# Patient Record
Sex: Female | Born: 1990 | Race: White | Hispanic: No | Marital: Single | Smoking: Never smoker
Health system: Southern US, Community
[De-identification: ages and names within clinical notes are randomized; demographics above are authoritative.]

## PROBLEM LIST (undated history)

## (undated) DIAGNOSIS — Q909 Down syndrome, unspecified: Secondary | ICD-10-CM

## (undated) DIAGNOSIS — E039 Hypothyroidism, unspecified: Secondary | ICD-10-CM

## (undated) DIAGNOSIS — J302 Other seasonal allergic rhinitis: Secondary | ICD-10-CM

## (undated) DIAGNOSIS — B009 Herpesviral infection, unspecified: Secondary | ICD-10-CM

## (undated) HISTORY — DX: Down syndrome, unspecified: Q90.9

## (undated) HISTORY — DX: Other seasonal allergic rhinitis: J30.2

## (undated) HISTORY — DX: Herpesviral infection, unspecified: B00.9

## (undated) HISTORY — DX: Hypothyroidism, unspecified: E03.9

## (undated) HISTORY — PX: NO PAST SURGERIES: SHX2092

---

## 2013-03-05 LAB — VITAMIN D 25 HYDROXY (VIT D DEFICIENCY, FRACTURES): Vit D, 25-Hydroxy: 19.2

## 2013-03-05 LAB — LIPID PANEL
Cholesterol: 208 mg/dL — AB (ref 0–200)
HDL: 60 mg/dL (ref 35–70)
LDL (calc): 136
Triglycerides: 60

## 2013-06-03 ENCOUNTER — Telehealth: Payer: Self-pay | Admitting: Family Medicine

## 2013-06-03 NOTE — Telephone Encounter (Signed)
Recommend [facility] evaluation for severe diarrhea with weight loss as I don't think we'll be able to get pt in this week. May place on Monday 2/23 at 2:15 for 30 min appt.

## 2013-06-03 NOTE — Telephone Encounter (Signed)
Pt has down syndrome and is new to this area. Per her home care coordinator pt is experiencing severe diarrhea and weight loss.  They suspect she possibly has Celiac disease.  Pt is also not comfortable w/drs., but is not aggressive.They have heard great things about you and are trying to est w/you as her PCP.  Your first new patient appmt is not until Aug 17, 2013, but they feel pt needs to be seen prior to this.  Can you accommodate a new patient apptmt w/in the next couple of weeks?   I'm not sure if you would want/need a 45 min apptmt or 30 min apptmt w/this pt. Thank you.

## 2013-06-04 NOTE — Telephone Encounter (Signed)
Pt scheduled 06/08/2013 @ 2:15 p.m.

## 2013-06-08 ENCOUNTER — Ambulatory Visit (INDEPENDENT_AMBULATORY_CARE_PROVIDER_SITE_OTHER): Payer: BC Managed Care – PPO | Admitting: Family Medicine

## 2013-06-08 ENCOUNTER — Encounter: Payer: Self-pay | Admitting: Family Medicine

## 2013-06-08 ENCOUNTER — Telehealth: Payer: Self-pay

## 2013-06-08 VITALS — BP 110/78 | HR 84 | Temp 98.1°F | Ht 58.75 in | Wt 120.0 lb

## 2013-06-08 DIAGNOSIS — J309 Allergic rhinitis, unspecified: Secondary | ICD-10-CM

## 2013-06-08 DIAGNOSIS — J302 Other seasonal allergic rhinitis: Secondary | ICD-10-CM | POA: Insufficient documentation

## 2013-06-08 DIAGNOSIS — Q909 Down syndrome, unspecified: Secondary | ICD-10-CM

## 2013-06-08 DIAGNOSIS — E039 Hypothyroidism, unspecified: Secondary | ICD-10-CM

## 2013-06-08 DIAGNOSIS — R197 Diarrhea, unspecified: Secondary | ICD-10-CM

## 2013-06-08 DIAGNOSIS — K130 Diseases of lips: Secondary | ICD-10-CM | POA: Insufficient documentation

## 2013-06-08 DIAGNOSIS — L089 Local infection of the skin and subcutaneous tissue, unspecified: Secondary | ICD-10-CM

## 2013-06-08 MED ORDER — MUPIROCIN CALCIUM 2 % EX CREA: 1.0000 "application " | TOPICAL_CREAM | Freq: Two times a day (BID) | CUTANEOUS | Status: DC

## 2013-06-08 MED ORDER — MUPIROCIN 2 % EX OINT: 1.0000 "application " | TOPICAL_OINTMENT | Freq: Two times a day (BID) | CUTANEOUS | Status: DC

## 2013-06-08 NOTE — Telephone Encounter (Signed)
Teresa Zhang with [facility] left v/m ; medicaid will not approve bactroban cream but will cover bactroban ointment; Teresa Zhang request new rx for bactroban ointment.Please advise.

## 2013-06-08 NOTE — Patient Instructions (Addendum)
For spot on belly, use antibiotic ointment and continue warm compresses.  This should resolve within the next few days. For lips try antibiotic ointment. For diarrhea, I will send you home with stool tests and would like to check blood work today. Good to meet you today, call us with questions.

## 2013-06-08 NOTE — Assessment & Plan Note (Signed)
Lives in group home.  Overall very well adjusted.

## 2013-06-08 NOTE — Assessment & Plan Note (Signed)
Last TSH 10/20144 WNL.  Recheck today.  Continue levothyroxine.

## 2013-06-08 NOTE — Progress Notes (Signed)
Pre visit review using our clinic review tool, if applicable. No additional management support is needed unless otherwise documented below in the visit note. 

## 2013-06-08 NOTE — Assessment & Plan Note (Signed)
Stable on PRN antihistamine. Continue.

## 2013-06-08 NOTE — Assessment & Plan Note (Signed)
Given honey colored crust, ?impetigo.  Prescribed mupirocin ointment, discussed use with abdominal wall pustule.

## 2013-06-08 NOTE — Assessment & Plan Note (Addendum)
Subacute for last 1.5 months. ?improvement off gluten and dairy. Will start with blood work (TSH, CBC, CMP, TTG) to eval for malabsorption but will also send off for C diff test as well as stool cultures to help r/o infectious cause. Doubt IBS. Pt/caregivers agree with plan. She has lost significant amt weight since she's moved into [facility] - ?overtreatment of hypothyroidism.

## 2013-06-08 NOTE — Progress Notes (Signed)
BP 110/78  Pulse 84  Temp(Src) 98.1 F (36.7 C) (Oral)  Ht 4' 10.75" (1.492 m)  Wt 120 lb (54.432 kg)  BMI 24.45 kg/m2  LMP 05/16/2013   CC: new pt to establish  Subjective:    Patient ID: Teresa Zhang, female    DOB: 10-23-1990, 23 y.o.   MRN: [id number]  HPI: Teresa Zhang is a 23 y.o. female presenting on 06/08/2013 with [place] is a pleasant 23 yo with h/o Down's Syndrome who presents with 2 caregivers Teresa Zhang is home coordinator) today to establish care.  Lives at [place].  Prior lived in [place] with parents Teresa Zhang mom).  Teresa Zhang moved in 03/2013.  Weight loss noted over last 2 months, but she's more active as well.  H/o hypothyroidism currently controlled on levothyroxine.  H/o seasonal allergies.  Would like lump on back checked.  Angular cheilitis - come and go.  Diarrhea - ongoing for the last 1+.  Intermittent diarrheal episodes loose stools mixed with watery stools unable to control.  No night time episodes.  05/31/2013 - changed diet to gluten/dairy free.  This has improved diarrhea - no unexplained episode until this morning.  Having episodes of fecal incontinence - surprise her.  Has 2-3 stools a day, some formed, some watery.  Light colored stools.  Uses well water (recently tested, normal according to [facility]).  No other sick contacts at home with diarrhea.  No recent abx use. No fevers/chills, abd pain, nausea/vomiting.  No blood in stool.  Occasional lower ribcage pain with diarrhea.  Recently had TSH checked 01/2013  Preventative: Has never had pap smear.   Relevant past medical, surgical, family and social history reviewed and updated as indicated.  Allergies and medications reviewed and updated. No current outpatient prescriptions on file prior to visit.   No current facility-administered medications on file prior to visit.    Review of Systems Per HPI unless specifically indicated above    Objective:    BP 110/78  Pulse 84   Temp(Src) 98.1 F (36.7 C) (Oral)  Ht 4' 10.75" (1.492 m)  Wt 120 lb (54.432 kg)  BMI 24.45 kg/m2  LMP 05/16/2013  Physical Exam  Nursing note and vitals reviewed. Constitutional: She is oriented to person, place, and time. She appears well-developed and well-nourished. No distress.  HENT:  Head: Normocephalic and atraumatic.    Right Ear: Hearing, tympanic membrane, external ear and ear canal normal.  Left Ear: Hearing, tympanic membrane, external ear and ear canal normal.  Nose: Nose normal.  Mouth/Throat: Uvula is midline, oropharynx is clear and moist and mucous membranes are normal. No oropharyngeal exudate, posterior oropharyngeal edema or posterior oropharyngeal erythema.  Peeling with honey colored crusting on edges of lips  Eyes: Conjunctivae and EOM are normal. Pupils are equal, round, and reactive to light. No scleral icterus.  Neck: Normal range of motion. Neck supple.  Cardiovascular: Normal rate, regular rhythm, normal heart sounds and intact distal pulses.   No murmur heard. Pulses:      Radial pulses are 2+ on the right side, and 2+ on the left side.  Pulmonary/Chest: Effort normal and breath sounds normal. No respiratory distress. She has no wheezes. She has no rales.  Abdominal: Soft. Bowel sounds are normal. She exhibits no distension and no mass. There is no hepatosplenomegaly. There is no tenderness. There is no rigidity, no rebound, no guarding, no CVA tenderness and negative Murphy's sign.  Musculoskeletal: Normal range of motion. She exhibits no  edema.  Lymphadenopathy:    She has no cervical adenopathy.  Neurological: She is alert and oriented to person, place, and time.  CN grossly intact, station and gait intact  Skin: Skin is warm and dry. No rash noted.  Cafe au lait spot on back Pustule with surrounding erythema on upper abdomen wall  Psychiatric: She has a normal mood and affect. Her behavior is normal. Judgment and thought content normal.   No  results found for this or any previous visit.    Assessment & Plan:   Problem List Items Addressed This Visit   Angular cheilitis     Given honey colored crust, ?impetigo.  Prescribed mupirocin ointment, discussed use with abdominal wall pustule.    Diarrhea - Primary     Subacute for last 1.5 months. ?improvement off gluten and dairy. Will start with blood work (TSH, CBC, CMP, TTG) to eval for malabsorption but will also send off for C diff test as well as stool cultures to help r/o infectious cause. Doubt IBS. Pt/caregivers agree with plan. She has lost significant amt weight since she's moved into [facility] - ?overtreatment of hypothyroidism.    Relevant Orders      Comprehensive metabolic panel      TSH      CBC with Differential      Clostridium difficile EIA      Stool culture      Fecal Lactoferrin      Tissue Transglutaminase, IGG   Down's syndrome     Lives in group home.  Overall very well adjusted.    Relevant Orders      Tissue Transglutaminase, IGG   Hypothyroidism     Last TSH 10/20144 WNL.  Recheck today.  Continue levothyroxine.    Relevant Medications      levothyroxine (SYNTHROID, LEVOTHROID) 125 MCG tablet   Other Relevant Orders      Tissue Transglutaminase, IGG   Pustule     Advised warm compresses and mupirocin ointment.    Seasonal allergies     Stable on PRN antihistamine. Continue.    Relevant Orders      Tissue Transglutaminase, IGG       Follow up plan: Return as needed, for follow up.

## 2013-06-08 NOTE — Telephone Encounter (Signed)
Sent in ointment

## 2013-06-09 DIAGNOSIS — L089 Local infection of the skin and subcutaneous tissue, unspecified: Secondary | ICD-10-CM | POA: Insufficient documentation

## 2013-06-09 LAB — CBC WITH DIFFERENTIAL/PLATELET
Basophils Absolute: 0 10*3/uL (ref 0.0–0.1)
Basophils Relative: 0.5 % (ref 0.0–3.0)
EOS ABS: 0.1 10*3/uL (ref 0.0–0.7)
Eosinophils Relative: 1 % (ref 0.0–5.0)
HCT: 42.7 % (ref 36.0–46.0)
Hemoglobin: 13.9 g/dL (ref 12.0–15.0)
Lymphocytes Relative: 21.2 % (ref 12.0–46.0)
Lymphs Abs: 1.5 10*3/uL (ref 0.7–4.0)
MCHC: 32.7 g/dL (ref 30.0–36.0)
MCV: 95.4 fl (ref 78.0–100.0)
MONO ABS: 0.5 10*3/uL (ref 0.1–1.0)
Monocytes Relative: 7.1 % (ref 3.0–12.0)
NEUTROS PCT: 70.2 % (ref 43.0–77.0)
Neutro Abs: 4.9 10*3/uL (ref 1.4–7.7)
PLATELETS: 300 10*3/uL (ref 150.0–400.0)
RBC: 4.47 Mil/uL (ref 3.87–5.11)
RDW: 14 % (ref 11.5–14.6)
WBC: 7 10*3/uL (ref 4.5–10.5)

## 2013-06-09 LAB — COMPREHENSIVE METABOLIC PANEL
ALT: 24 U/L (ref 0–35)
AST: 28 U/L (ref 0–37)
Albumin: 4.4 g/dL (ref 3.5–5.2)
Alkaline Phosphatase: 68 U/L (ref 39–117)
BUN: 10 mg/dL (ref 6–23)
CALCIUM: 9.5 mg/dL (ref 8.4–10.5)
CHLORIDE: 110 meq/L (ref 96–112)
CO2: 25 mEq/L (ref 19–32)
CREATININE: 0.7 mg/dL (ref 0.4–1.2)
GFR: 107.33 mL/min (ref >60.00)
Glucose, Bld: 82 mg/dL (ref 70–99)
POTASSIUM: 4 meq/L (ref 3.5–5.1)
Sodium: 141 mEq/L (ref 135–145)
Total Bilirubin: 0.4 mg/dL (ref 0.3–1.2)
Total Protein: 8.2 g/dL (ref 6.0–8.3)

## 2013-06-09 LAB — TSH: TSH: 0.48 u[IU]/mL (ref 0.35–5.50)

## 2013-06-09 LAB — TISSUE TRANSGLUTAMINASE, IGG: Tissue Transglut Ab: 10.6 U/mL (ref <20)

## 2013-06-09 NOTE — Assessment & Plan Note (Signed)
Advised warm compresses and mupirocin ointment.

## 2013-06-10 ENCOUNTER — Encounter: Payer: Self-pay | Admitting: *Deleted

## 2013-06-10 ENCOUNTER — Ambulatory Visit: Payer: BC Managed Care – PPO

## 2013-06-10 DIAGNOSIS — E039 Hypothyroidism, unspecified: Secondary | ICD-10-CM

## 2013-06-10 LAB — T4, FREE: FREE T4: 0.9 ng/dL (ref 0.60–1.60)

## 2013-06-10 NOTE — Addendum Note (Signed)
Addended by: Ellamae Sia on: 06/10/2013 11:40 AM   Modules accepted: Orders

## 2013-06-11 LAB — CLOSTRIDIUM DIFFICILE EIA: CDIFTX: NEGATIVE

## 2013-06-11 LAB — FECAL LACTOFERRIN, QUANT: LACTOFERRIN: POSITIVE

## 2013-06-14 LAB — STOOL CULTURE

## 2013-06-17 ENCOUNTER — Encounter: Payer: Self-pay | Admitting: Family Medicine

## 2013-06-19 ENCOUNTER — Other Ambulatory Visit: Payer: Self-pay | Admitting: Family Medicine

## 2013-06-19 MED ORDER — CIPROFLOXACIN HCL 500 MG PO TABS: 500.0000 mg | ORAL_TABLET | Freq: Two times a day (BID) | ORAL | Status: DC

## 2013-06-30 ENCOUNTER — Ambulatory Visit (INDEPENDENT_AMBULATORY_CARE_PROVIDER_SITE_OTHER): Payer: BC Managed Care – PPO | Admitting: Internal Medicine

## 2013-06-30 ENCOUNTER — Encounter: Payer: Self-pay | Admitting: Internal Medicine

## 2013-06-30 VITALS — BP 106/74 | HR 67 | Temp 97.8°F | Wt 117.0 lb

## 2013-06-30 DIAGNOSIS — J029 Acute pharyngitis, unspecified: Secondary | ICD-10-CM

## 2013-06-30 DIAGNOSIS — J309 Allergic rhinitis, unspecified: Secondary | ICD-10-CM

## 2013-06-30 MED ORDER — GUAIFENESIN 100 MG/5ML PO SYRP: 200.0000 mg | ORAL_SOLUTION | Freq: Three times a day (TID) | ORAL | Status: DC | PRN

## 2013-06-30 NOTE — Patient Instructions (Addendum)
Pharyngitis °Pharyngitis is redness, pain, and swelling (inflammation) of your pharynx.  °CAUSES  °Pharyngitis is usually caused by infection. Most of the time, these infections are from viruses (viral) and are part of a cold. However, sometimes pharyngitis is caused by bacteria (bacterial). Pharyngitis can also be caused by allergies. Viral pharyngitis may be spread from person to person by coughing, sneezing, and personal items or utensils (cups, forks, spoons, toothbrushes). Bacterial pharyngitis may be spread from person to person by more intimate contact, such as kissing.  °SIGNS AND SYMPTOMS  °Symptoms of pharyngitis include:   °· Sore throat.   °· Tiredness (fatigue).   °· Low-grade fever.   °· Headache. °· Joint pain and muscle aches. °· Skin rashes. °· Swollen lymph nodes. °· Plaque-like film on throat or tonsils (often seen with bacterial pharyngitis). °DIAGNOSIS  °Your health care provider will ask you questions about your illness and your symptoms. Your medical history, along with a physical exam, is often all that is needed to diagnose pharyngitis. Sometimes, a rapid strep test is done. Other lab tests may also be done, depending on the suspected cause.  °TREATMENT  °Viral pharyngitis will usually get better in 3 4 days without the use of medicine. Bacterial pharyngitis is treated with medicines that kill germs (antibiotics).  °HOME CARE INSTRUCTIONS  °· Drink enough water and fluids to keep your urine clear or pale yellow.   °· Only take over-the-counter or prescription medicines as directed by your health care provider:   °· If you are prescribed antibiotics, make sure you finish them even if you start to feel better.   °· Do not take aspirin.   °· Get lots of rest.   °· Gargle with 8 oz of salt water (½ tsp of salt per 1 qt of water) as often as every 1 2 hours to soothe your throat.   °· Throat lozenges (if you are not at risk for choking) or sprays may be used to soothe your throat. °SEEK MEDICAL  CARE IF:  °· You have large, tender lumps in your neck. °· You have a rash. °· You cough up green, yellow-brown, or bloody spit. °SEEK IMMEDIATE MEDICAL CARE IF:  °· Your neck becomes stiff. °· You drool or are unable to swallow liquids. °· You vomit or are unable to keep medicines or liquids down. °· You have severe pain that does not go away with the use of recommended medicines. °· You have trouble breathing (not caused by a stuffy nose). °MAKE SURE YOU:  °· Understand these instructions. °· Will watch your condition. °· Will get help right away if you are not doing well or get worse. °Document Released: 04/02/2005 Document Revised: 01/21/2013 Document Reviewed: 12/08/2012 °ExitCare® Patient Information ©2014 [employer]. ° °

## 2013-06-30 NOTE — Progress Notes (Signed)
Pre visit review using our clinic review tool, if applicable. No additional management support is needed unless otherwise documented below in the visit note. 

## 2013-06-30 NOTE — Progress Notes (Signed)
HPI  Pt presents to the clinic today with c/o sore throat and cough. She reports this started yesterday. She also c/o runny nose and itchy ears. She is having difficulty with eating and some pain with swallowing. She does have a history of seasonal allergies. She has not had sick contacts. She is not sure what she has tried OTC.  Review of Systems      Past Medical History  Diagnosis Date  . Down's syndrome   . Seasonal allergies   . Hypothyroidism     Family History  Problem Relation Age of Onset  . Cancer Other     colon, great uncle  . Cancer Other     prostate, grandparent  . Hypertension Father   . Hyperlipidemia Father   . Hashimoto's thyroiditis Sister   . Cancer Father     throat    History   Social History  . Marital Status: Single    Spouse Name: N/A    Number of Children: N/A  . Years of Education: N/A   Occupational History  . Not on file.   Social History Main Topics  . Smoking status: Never Smoker   . Smokeless tobacco: Never Used  . Alcohol Use: No  . Drug Use: No  . Sexual Activity: Not on file   Other Topics Concern  . Not on file   Social History Narrative   Lives at [facility]    No Known Allergies   Constitutional:  Denies headache, fatigue, fever or abrupt weight changes. sitive feve HEENT:  Positive sore throat. Denies eye redness, eye pain, pressure behind the eyes, facial pain, nasal congestion, ear pain, ringing in the ears, wax buildup, runny nose or bloody nose. Respiratory: Positive cough. Denies difficulty breathing or shortness of breath.  Cardiovascular: Denies chest pain, chest tightness, palpitations or swelling in the hands or feet.   No other specific complaints in a complete review of systems (except as listed in HPI above).  Objective:   BP 106/74  Pulse 67  Temp(Src) 97.8 F (36.6 C) (Oral)  Wt 117 lb (53.071 kg)  SpO2 97%  LMP 05/16/2013 Wt Readings from Last 3 Encounters:  06/30/13  117 lb (53.071 kg)  06/08/13 120 lb (54.432 kg)     General: PT with down syndrome, in NAD HEENT: Head: normal shape and size; Eyes: sclera white, no icterus, conjunctiva pink, PERRLA and EOMs intact; Ears: Tm's gray and intact, normal light reflex; Nose: mucosa pink and moist, septum midline; Throat/Mouth: + PND. Teeth present, mucosa erythematous and moist, no exudate noted, no lesions or ulcerations noted.  Neck: Neck supple, trachea midline. No massses, lumps or thyromegaly present.  Cardiovascular: Normal rate and rhythm. S1,S2 noted.  No murmur, rubs or gallops noted. No JVD or BLE edema. No carotid bruits noted. Pulmonary/Chest: Normal effort and positive vesicular breath sounds. No respiratory distress. No wheezes, rales or ronchi noted.      Assessment & Plan:   Allergic Rhinitis:  Get some rest and drink plenty of water Do salt water gargles for the sore throat Continue zyrtec OTC daily eRx for robitussin  RTC as needed or if symptoms persist.

## 2013-07-03 ENCOUNTER — Encounter: Payer: Self-pay | Admitting: Family Medicine

## 2013-07-03 MED ORDER — LEVOTHYROXINE SODIUM 125 MCG PO TABS: 125.0000 ug | ORAL_TABLET | Freq: Every day | ORAL | Status: DC

## 2013-07-09 ENCOUNTER — Encounter: Payer: Self-pay | Admitting: Internal Medicine

## 2013-07-09 ENCOUNTER — Ambulatory Visit (INDEPENDENT_AMBULATORY_CARE_PROVIDER_SITE_OTHER): Payer: BC Managed Care – PPO | Admitting: Internal Medicine

## 2013-07-09 VITALS — BP 120/70 | HR 54 | Temp 98.2°F | Wt 116.5 lb

## 2013-07-09 DIAGNOSIS — J309 Allergic rhinitis, unspecified: Secondary | ICD-10-CM

## 2013-07-09 DIAGNOSIS — J069 Acute upper respiratory infection, unspecified: Secondary | ICD-10-CM

## 2013-07-09 MED ORDER — FLUTICASONE PROPIONATE 50 MCG/ACT NA SUSP: 2.0000 | Freq: Every day | NASAL | Status: DC

## 2013-07-09 MED ORDER — AZITHROMYCIN 250 MG PO TABS
ORAL_TABLET | ORAL | Status: DC
Start: 2013-07-09 — End: 2013-08-06

## 2013-07-09 NOTE — Progress Notes (Signed)
Pre visit review using our clinic review tool, if applicable. No additional management support is needed unless otherwise documented below in the visit note. 

## 2013-07-09 NOTE — Patient Instructions (Addendum)

## 2013-07-09 NOTE — Progress Notes (Addendum)
HPI  Pt presents to the clinic today with c/o cough and chest congestion. This started 10 days ago. She also c/o sore throat and wheezing at night. She was seen for similar symptoms 06/30/13. It was felt to be related to allergies and she was advised to take zyrtec OTC. She has been taking zyrtec without any relief.   Review of Systems      Past Medical History  Diagnosis Date  . Down's syndrome   . Seasonal allergies   . Hypothyroidism     Family History  Problem Relation Age of Onset  . Cancer Other     colon, great uncle  . Cancer Other     prostate, grandparent  . Hypertension Father   . Hyperlipidemia Father   . Hashimoto's thyroiditis Sister   . Cancer Father     throat    History   Social History  . Marital Status: Single    Spouse Name: N/A    Number of Children: N/A  . Years of Education: N/A   Occupational History  . Not on file.   Social History Main Topics  . Smoking status: Never Smoker   . Smokeless tobacco: Never Used  . Alcohol Use: No  . Drug Use: No  . Sexual Activity: Not on file   Other Topics Concern  . Not on file   Social History Narrative   Lives at [place]    No Known Allergies   Constitutional:  Denies headache, fatigue, fever or abrupt weight changes.  HEENT:  Positive sore throat. Denies eye redness, eye pain, pressure behind the eyes, facial pain, nasal congestion, ear pain, ringing in the ears, wax buildup, runny nose or bloody nose. Respiratory: Positive cough. Denies difficulty breathing or shortness of breath.  Cardiovascular: Denies chest pain, chest tightness, palpitations or swelling in the hands or feet.   No other specific complaints in a complete review of systems (except as listed in HPI above).  Objective:   BP 120/70  Pulse 54  Temp(Src) 98.2 F (36.8 C) (Oral)  Wt 116 lb 8 oz (52.844 kg)  SpO2 98%  LMP 06/29/2013 Wt Readings from Last 3 Encounters:  07/09/13 116 lb 8 oz (52.844  kg)  06/30/13 117 lb (53.071 kg)  06/08/13 120 lb (54.432 kg)     General: Appears her stated age, well developed, well nourished in NAD. HEENT: Head: normal shape and size; Eyes: sclera white, no icterus, conjunctiva pink, PERRLA and EOMs intact; Ears: Tm's gray and intact, normal light reflex; Nose: mucosa pink and moist, septum midline; Throat/Mouth: + PND. Teeth present, mucosa erythematous and moist, no exudate noted, no lesions or ulcerations noted.  Neck: Neck supple, trachea midline. No massses, lumps or thyromegaly present.  Cardiovascular: Normal rate and rhythm. S1,S2 noted.  No murmur, rubs or gallops noted. No JVD or BLE edema. No carotid bruits noted. Pulmonary/Chest: Normal effort and wheezing noted in the left upper lobe. No respiratory distress. No  rales or ronchi noted.      Assessment & Plan:   Allergic Rhinitis:  Get some rest and drink plenty of water Do salt water gargles for the sore throat eRx for flonase daily x 1 week  If no improvement, start z pack   RTC as needed or if symptoms persist.

## 2013-07-11 ENCOUNTER — Encounter: Payer: Self-pay | Admitting: Family Medicine

## 2013-08-03 ENCOUNTER — Encounter: Payer: Self-pay | Admitting: *Deleted

## 2013-08-06 ENCOUNTER — Ambulatory Visit (INDEPENDENT_AMBULATORY_CARE_PROVIDER_SITE_OTHER): Payer: BC Managed Care – PPO | Admitting: Internal Medicine

## 2013-08-06 ENCOUNTER — Encounter: Payer: Self-pay | Admitting: Internal Medicine

## 2013-08-06 VITALS — BP 104/68 | HR 74 | Temp 97.9°F | Wt 111.0 lb

## 2013-08-06 DIAGNOSIS — H9201 Otalgia, right ear: Secondary | ICD-10-CM

## 2013-08-06 DIAGNOSIS — R197 Diarrhea, unspecified: Secondary | ICD-10-CM

## 2013-08-06 DIAGNOSIS — H6120 Impacted cerumen, unspecified ear: Secondary | ICD-10-CM

## 2013-08-06 DIAGNOSIS — H9209 Otalgia, unspecified ear: Secondary | ICD-10-CM

## 2013-08-06 DIAGNOSIS — R634 Abnormal weight loss: Secondary | ICD-10-CM

## 2013-08-06 NOTE — Progress Notes (Signed)
Pre visit review using our clinic review tool, if applicable. No additional management support is needed unless otherwise documented below in the visit note. 

## 2013-08-06 NOTE — Patient Instructions (Addendum)
Otalgia °The most common reason for this in children is an infection of the middle ear. Pain from the middle ear is usually caused by a build-up of fluid and pressure behind the eardrum. Pain from an earache can be sharp, dull, or burning. The pain may be temporary or constant. The middle ear is connected to the nasal passages by a short narrow tube called the Eustachian tube. The Eustachian tube allows fluid to drain out of the middle ear, and helps keep the pressure in your ear equalized. °CAUSES  °A cold or allergy can block the Eustachian tube with inflammation and the build-up of secretions. This is especially likely in small children, because their Eustachian tube is shorter and more horizontal. When the Eustachian tube closes, the normal flow of fluid from the middle ear is stopped. Fluid can accumulate and cause stuffiness, pain, hearing loss, and an ear infection if germs start growing in this area. °SYMPTOMS  °The symptoms of an ear infection may include fever, ear pain, fussiness, increased crying, and irritability. Many children will have temporary and minor hearing loss during and right after an ear infection. Permanent hearing loss is rare, but the risk increases the more infections a child has. Other causes of ear pain include retained water in the outer ear canal from swimming and bathing. °Ear pain in adults is less likely to be from an ear infection. Ear pain may be referred from other locations. Referred pain may be from the joint between your jaw and the skull. It may also come from a tooth problem or problems in the neck. Other causes of ear pain include: °· A foreign body in the ear. °· Outer ear infection. °· Sinus infections. °· Impacted ear wax. °· Ear injury. °· Arthritis of the jaw or TMJ problems. °· Middle ear infection. °· Tooth infections. °· Sore throat with pain to the ears. °DIAGNOSIS  °Your caregiver can usually make the diagnosis by examining you. Sometimes other special studies,  including x-rays and lab work may be necessary. °TREATMENT  °· If antibiotics were prescribed, use them as directed and finish them even if you or your child's symptoms seem to be improved. °· Sometimes PE tubes are needed in children. These are little plastic tubes which are put into the eardrum during a simple surgical procedure. They allow fluid to drain easier and allow the pressure in the middle ear to equalize. This helps relieve the ear pain caused by pressure changes. °HOME CARE INSTRUCTIONS  °· Only take over-the-counter or prescription medicines for pain, discomfort, or fever as directed by your caregiver. DO NOT GIVE CHILDREN ASPIRIN because of the association of Reye's Syndrome in children taking aspirin. °· Use a cold pack applied to the outer ear for 15-20 minutes, 03-04 times per day or as needed may reduce pain. Do not apply ice directly to the skin. You may cause frost bite. °· Over-the-counter ear drops used as directed may be effective. Your caregiver may sometimes prescribe ear drops. °· Resting in an upright position may help reduce pressure in the middle ear and relieve pain. °· Ear pain caused by rapidly descending from high altitudes can be relieved by swallowing or chewing gum. Allowing infants to suck on a bottle during airplane travel can help. °· Do not smoke in the house or near children. If you are unable to quit smoking, smoke outside. °· Control allergies. °SEEK IMMEDIATE MEDICAL CARE IF:  °· You or your child are becoming sicker. °· Pain or fever   relief is not obtained with medicine. °· You or your child's symptoms (pain, fever, or irritability) do not improve within 24 to 48 hours or as instructed. °· Severe pain suddenly stops hurting. This may indicate a ruptured eardrum. °· You or your children develop new problems such as severe headaches, stiff neck, difficulty swallowing, or swelling of the face or around the ear. °Document Released: 11/18/2003 Document Revised: 06/25/2011  Document Reviewed: 03/24/2008 °Exit[employer]® Patient Information ©2014 [employer]. ° °

## 2013-08-06 NOTE — Progress Notes (Signed)
Subjective:    Patient ID: Teresa Zhang, female    DOB: 1990-07-13, 23 y.o.   MRN: [id number]  HPI  Pt presents to the clinic today with c/o right ear pain x 1 day. She denies fever, chills or body aches. She has not tried anything OTC. She denies hearing loss.  She is concerned about weight loss. She reports that she has lost 15 lbs since December. She has been eating better and exercising during that time but she stopped exercising 1 month ago but has continued to lose weight. She has also had diarrhea for the last 3 weeks but has now resolved. She denies blood in her stool. She is hypothyroid, but her last TSH was normal. She saw Dr. Lynnae Sandhoff for the same 06/08/13 and workup was normal.  Review of Systems      Past Medical History  Diagnosis Date  . Down's syndrome   . Seasonal allergies   . Hypothyroidism   . HSV-1 infection     Current Outpatient Prescriptions  Medication Sig Dispense Refill  . cetirizine (ZYRTEC) 10 MG tablet Take 10 mg by mouth daily.      . fluticasone (FLONASE) 50 MCG/ACT nasal spray Place 2 sprays into both nostrils daily.  16 g  6  . guaifenesin (ROBITUSSIN CHEST CONGESTION) 100 MG/5ML syrup Take 10 mLs (200 mg total) by mouth 3 (three) times daily as needed for cough.  120 mL  0  . ibuprofen (ADVIL,MOTRIN) 200 MG tablet Take 200 mg by mouth as needed.      Marland Kitchen levothyroxine (SYNTHROID, LEVOTHROID) 125 MCG tablet Take 1 tablet (125 mcg total) by mouth daily before breakfast.  90 tablet  3  . Multiple Vitamins-Minerals (MULTIVITAMIN GUMMIES ADULT PO) Take 1 tablet by mouth every Monday, Wednesday, and Friday.      . mupirocin ointment (BACTROBAN) 2 % Apply 1 application topically 2 (two) times daily. To affected areas on abdomen and lips.  30 g  0   No current facility-administered medications for this visit.    No Known Allergies  Family History  Problem Relation Age of Onset  . Cancer Other     colon, great uncle  . Cancer Other     prostate,  grandparent  . Hypertension Father   . Hyperlipidemia Father   . Hashimoto's thyroiditis Sister   . Cancer Father     throat    History   Social History  . Marital Status: Single    Spouse Name: N/A    Number of Children: N/A  . Years of Education: N/A   Occupational History  . Not on file.   Social History Main Topics  . Smoking status: Never Smoker   . Smokeless tobacco: Never Used  . Alcohol Use: No  . Drug Use: No  . Sexual Activity: Not on file   Other Topics Concern  . Not on file   Social History Narrative   Lives at [place]     Constitutional: Pt reports weight loss. Denies fever, malaise, fatigue, headache.  HEENT: Pt reports ear pain. Denies eye pain, eye redness, ringing in the ears, wax buildup, runny nose, nasal congestion, bloody nose, or sore throat. Respiratory: Denies difficulty breathing, shortness of breath, cough or sputum production.     No other specific complaints in a complete review of systems (except as listed in HPI above).  Objective:   Physical Exam   BP 104/68  Pulse 74  Temp(Src) 97.9  F (36.6 C) (Oral)  Wt 111 lb (50.349 kg)  SpO2 98%  LMP 06/29/2013 Wt Readings from Last 3 Encounters:  08/06/13 111 lb (50.349 kg)  07/09/13 116 lb 8 oz (52.844 kg)  06/30/13 117 lb (53.071 kg)    General: 23 yo female with down syndrome, well nourished in NAD. HEENT: Head: normal shape and size; Eyes: sclera white, no icterus, conjunctiva pink, PERRLA and EOMs intact; Ears: cerumen impaction bilaterally; Nose: mucosa pink and moist, septum midline; Throat/Mouth: Teeth present, mucosa pink and moist, no exudate, lesions or ulcerations noted.  Cardiovascular: Normal rate and rhythm. S1,S2 noted.  No murmur, rubs or gallops noted. No JVD or BLE edema. No carotid bruits noted. Pulmonary/Chest: Normal effort and positive vesicular breath sounds. No respiratory distress. No wheezes, rales or ronchi noted.      BMET      Component Value Date/Time   NA 141 06/08/2013 1538   K 4.0 06/08/2013 1538   CL 110 06/08/2013 1538   CO2 25 06/08/2013 1538   GLUCOSE 82 06/08/2013 1538   BUN 10 06/08/2013 1538   CREATININE 0.7 06/08/2013 1538   CALCIUM 9.5 06/08/2013 1538    Lipid Panel     Component Value Date/Time   CHOL 208* 03/05/2013   TRIG 60 03/05/2013   HDL 60 03/05/2013   LDLCALC 136 03/05/2013    CBC    Component Value Date/Time   WBC 7.0 06/08/2013 1538   RBC 4.47 06/08/2013 1538   HGB 13.9 06/08/2013 1538   HCT 42.7 06/08/2013 1538   PLT 300.0 06/08/2013 1538   MCV 95.4 06/08/2013 1538   MCHC 32.7 06/08/2013 1538   RDW 14.0 06/08/2013 1538   LYMPHSABS 1.5 06/08/2013 1538   MONOABS 0.5 06/08/2013 1538   EOSABS 0.1 06/08/2013 1538   BASOSABS 0.0 06/08/2013 1538    Hgb A1C No results found for this basename: HGBA1C        Assessment & Plan:   Otalgia, right ear secondary to cerumen impaction:  Manual lavage by CMA today OK to use debrox solution OTC prn for wax buildup  Weight loss and diarrhea:  Previous workup negative If it continues to bother you, would advise follow up with PCP  RTC as needed or if symptoms persist or worsen

## 2013-09-24 ENCOUNTER — Ambulatory Visit (INDEPENDENT_AMBULATORY_CARE_PROVIDER_SITE_OTHER): Payer: BC Managed Care – PPO | Admitting: Family Medicine

## 2013-09-24 ENCOUNTER — Encounter: Payer: Self-pay | Admitting: Family Medicine

## 2013-09-24 VITALS — BP 116/76 | HR 72 | Temp 98.2°F | Wt 111.2 lb

## 2013-09-24 DIAGNOSIS — J019 Acute sinusitis, unspecified: Secondary | ICD-10-CM | POA: Insufficient documentation

## 2013-09-24 MED ORDER — AMOXICILLIN-POT CLAVULANATE 875-125 MG PO TABS: 1.0000 | ORAL_TABLET | Freq: Two times a day (BID) | ORAL | Status: AC

## 2013-09-24 MED ORDER — HYDROCODONE-HOMATROPINE 5-1.5 MG/5ML PO SYRP: 5.0000 mL | ORAL_SOLUTION | Freq: Three times a day (TID) | ORAL | Status: DC | PRN

## 2013-09-24 NOTE — Progress Notes (Signed)
BP 116/76  Pulse 72  Temp(Src) 98.2 F (36.8 C) (Oral)  Wt 111 lb 4 oz (50.463 kg)  LMP 09/21/2013   CC: ?sinusitis  Subjective:    Patient ID: Teresa Zhang, female    DOB: Aug 25, 1990, 23 y.o.   MRN: [id number]  HPI: Teresa Zhang is a 23 y.o. female presenting on 09/24/2013 for Sinusitis   Presents with caregiver who helps give story. Teresa Zhang has a 10d h/o nasal congestion that has progressively worsened from clear to yellow, dry coughing that wakes her up at night at 1am and again at 4am.  abd pain/cramping - on period.  No fevers/chills, HA. No h/o asthma. No sick contacts at home. Taking flonase, as well as daytime/night time cold meds.  Past Medical History  Diagnosis Date  . Down's syndrome   . Seasonal allergies   . Hypothyroidism   . HSV-1 infection      Relevant past medical, surgical, family and social history reviewed and updated as indicated.  Allergies and medications reviewed and updated. Current Outpatient Prescriptions on File Prior to Visit  Medication Sig  . cetirizine (ZYRTEC) 10 MG tablet Take 10 mg by mouth daily.  . fluticasone (FLONASE) 50 MCG/ACT nasal spray Place 2 sprays into both nostrils daily.  Marland Kitchen ibuprofen (ADVIL,MOTRIN) 200 MG tablet Take 200 mg by mouth as needed.  Marland Kitchen levothyroxine (SYNTHROID, LEVOTHROID) 125 MCG tablet Take 1 tablet (125 mcg total) by mouth daily before breakfast.  . Multiple Vitamins-Minerals (MULTIVITAMIN GUMMIES ADULT PO) Take 1 tablet by mouth every Monday, Wednesday, and Friday.  . mupirocin ointment (BACTROBAN) 2 % Apply 1 application topically 2 (two) times daily. To affected areas on abdomen and lips.   No current facility-administered medications on file prior to visit.    Review of Systems Per HPI unless specifically indicated above    Objective:    BP 116/76  Pulse 72  Temp(Src) 98.2 F (36.8 C) (Oral)  Wt 111 lb 4 oz (50.463 kg)  LMP 09/21/2013  Physical Exam  Nursing note and vitals  reviewed. Constitutional: She appears well-developed and well-nourished. No distress.  HENT:  Head: Normocephalic and atraumatic.  Right Ear: Hearing, tympanic membrane, external ear and ear canal normal.  Left Ear: Hearing, external ear and ear canal normal.  Nose: Mucosal edema present. No rhinorrhea. Right sinus exhibits no maxillary sinus tenderness and no frontal sinus tenderness. Left sinus exhibits no maxillary sinus tenderness and no frontal sinus tenderness.  Mouth/Throat: Uvula is midline, oropharynx is clear and moist and mucous membranes are normal. No oropharyngeal exudate, posterior oropharyngeal edema, posterior oropharyngeal erythema or tonsillar abscesses.  Cerumen blocking L TM Pale boggy turbinates Dry lips  Eyes: Conjunctivae and EOM are normal. Pupils are equal, round, and reactive to light. No scleral icterus.  Neck: Normal range of motion. Neck supple.  Cardiovascular: Normal rate, regular rhythm, normal heart sounds and intact distal pulses.   No murmur heard. Pulmonary/Chest: Effort normal and breath sounds normal. No respiratory distress. She has no wheezes. She has no rales.  Lymphadenopathy:    She has no cervical adenopathy.  Skin: Skin is warm and dry. No rash noted.   Results for orders placed in visit on 08/03/13  LIPID PANEL      Result Value Ref Range   Cholesterol 208 (*) 0 - 200 mg/dL   Triglycerides 60     HDL 60  35 - 70 mg/dL   LDL (calc) 136    VITAMIN D 25 HYDROXY  Result Value Ref Range   Vit D, 25-Hydroxy 19.2        Assessment & Plan:   Problem List Items Addressed This Visit   Acute sinusitis - Primary     Anticipate acute bacterial sinusitis given duration of sxs and recent progression/deterioration. Treat with augmentin course - advised take with yogurt given h/o sensitive stomach. Hydrocodone cough syrup prescribed as well for night time cough. Pt/caregiver agree with plan. Red flags to return discussed.    Relevant  Medications      HYDROCODONE-HOMATROPINE 5-1.5 MG/5ML PO SYRP      amoxicillin-clavulanate (AUGMENTIN) tablet 875-125 mg       Follow up plan: Return if symptoms worsen or fail to improve.

## 2013-09-24 NOTE — Assessment & Plan Note (Addendum)
Anticipate acute bacterial sinusitis given duration of sxs and recent progression/deterioration. Treat with augmentin course - advised take with yogurt given h/o sensitive stomach. Hydrocodone cough syrup prescribed as well for night time cough. Pt/caregiver agree with plan. Red flags to return discussed.

## 2013-09-24 NOTE — Progress Notes (Signed)
Pre visit review using our clinic review tool, if applicable. No additional management support is needed unless otherwise documented below in the visit note. 

## 2013-09-24 NOTE — Patient Instructions (Signed)
You have a sinus infection. Take medicine as prescribed: augmentin for 10 days, hydrocodone cough syrup for night time. Push fluids and plenty of rest. May use plain mucinex with plenty of fluid to help mobilize mucous. Let us know if fever >101.5, trouble opening/closing mouth, difficulty swallowing, or worsening productive cough - you may need to be seen again. Start some yogurt while on the augmentin.

## 2013-10-27 ENCOUNTER — Encounter: Payer: Self-pay | Admitting: Family Medicine

## 2013-10-27 ENCOUNTER — Ambulatory Visit (INDEPENDENT_AMBULATORY_CARE_PROVIDER_SITE_OTHER): Payer: BC Managed Care – PPO | Admitting: Family Medicine

## 2013-10-27 VITALS — BP 110/68 | HR 70 | Temp 98.7°F | Wt 112.0 lb

## 2013-10-27 DIAGNOSIS — H6121 Impacted cerumen, right ear: Secondary | ICD-10-CM

## 2013-10-27 DIAGNOSIS — H6120 Impacted cerumen, unspecified ear: Secondary | ICD-10-CM

## 2013-10-27 DIAGNOSIS — Q909 Down syndrome, unspecified: Secondary | ICD-10-CM

## 2013-10-27 NOTE — Assessment & Plan Note (Signed)
Unable to remove given pt intolerant of forceps and refuses irrigation. Recommend increasing ear wax drops to twice daily.

## 2013-10-27 NOTE — Progress Notes (Signed)
Pre visit review using our clinic review tool, if applicable. No additional management support is needed unless otherwise documented below in the visit note. 

## 2013-10-27 NOTE — Patient Instructions (Signed)
Increase debrox or hydrogen peroxide to twice daily. Get ear wet with showering.

## 2013-10-27 NOTE — Progress Notes (Signed)
   Subjective:    Patient ID: Tatiana Courter, female    DOB: 12/30/1990, 23 y.o.   MRN: [id number]  HPI  23 year old female with Down's syndrome and hx of frequent cerumen impaction comes to clinic today with new onset wax in  Right ear. She has some pain in right ear, decreased hearing. No fever.  no cough , no congestion.  Has been trying hydrogen peroxide drops lately without benefit.   Review of Systems  Constitutional: Negative for fever and fatigue.  HENT: Positive for ear pain. Negative for ear discharge.   Eyes: Negative for pain.  Respiratory: Negative for chest tightness and shortness of breath.   Cardiovascular: Negative for chest pain, palpitations and leg swelling.  Gastrointestinal: Negative for abdominal pain.  Genitourinary: Negative for dysuria.       Objective:   Physical Exam  Constitutional: Vital signs are normal. She appears well-developed and well-nourished. She is cooperative.  Non-toxic appearance. She does not appear ill. No distress.  HENT:  Head: Normocephalic.  Right Ear: Hearing, tympanic membrane, external ear and ear canal normal. Tympanic membrane is not erythematous, not retracted and not bulging.  Left Ear: Hearing, tympanic membrane, external ear and ear canal normal. Tympanic membrane is not erythematous, not retracted and not bulging.  Nose: No mucosal edema or rhinorrhea. Right sinus exhibits no maxillary sinus tenderness and no frontal sinus tenderness. Left sinus exhibits no maxillary sinus tenderness and no frontal sinus tenderness.  Mouth/Throat: Uvula is midline, oropharynx is clear and moist and mucous membranes are normal.  Right ear with mild ear wax. Pt did not tolerate attemps at removal with alligator forceps and refuses water irrigation.  Eyes: Conjunctivae, EOM and lids are normal. Pupils are equal, round, and reactive to light. Lids are everted and swept, no foreign bodies found.  Neck: Trachea normal and normal range of motion.  Neck supple. Carotid bruit is not present. No mass and no thyromegaly present.  Cardiovascular: Normal rate, regular rhythm, S1 normal, S2 normal, normal heart sounds, intact distal pulses and normal pulses.  Exam reveals no gallop and no friction rub.   No murmur heard. Pulmonary/Chest: Effort normal and breath sounds normal. Not tachypneic. No respiratory distress. She has no decreased breath sounds. She has no wheezes. She has no rhonchi. She has no rales.  Abdominal: Normal appearance.  Neurological: She is alert.  Skin: Skin is warm, dry and intact. No rash noted.  Psychiatric: Her speech is normal and behavior is normal. Judgment and thought content normal. Her mood appears not anxious. Cognition and memory are normal. She does not exhibit a depressed mood.          Assessment & Plan:

## 2014-03-29 ENCOUNTER — Telehealth: Payer: Self-pay

## 2014-03-29 MED ORDER — LORAZEPAM 0.5 MG PO TABS: 0.5000 mg | ORAL_TABLET | Freq: Three times a day (TID) | ORAL | Status: DC | PRN

## 2014-03-29 NOTE — Telephone Encounter (Signed)
Carolyn pts care giver has appt for 1st pap smear and pt is quite "worked up" about exam; pt has down's syndrome. Hoyle Sauer request med to help pt be calm for visit on 04/01/14 at 10:30 AM.Please advise.[place] advise. Carolyn request cb.

## 2014-03-29 NOTE — Telephone Encounter (Signed)
We could try ativan 0.5mg  prn. plz phone in.

## 2014-03-30 NOTE — Telephone Encounter (Signed)
Hoyle Sauer notified as instructed by telephone. Hoyle Sauer verbalized understanding. Rx called to pharmacy as instructed.

## 2014-04-01 ENCOUNTER — Encounter: Payer: Self-pay | Admitting: Family Medicine

## 2014-04-01 ENCOUNTER — Ambulatory Visit (INDEPENDENT_AMBULATORY_CARE_PROVIDER_SITE_OTHER): Payer: BC Managed Care – PPO | Admitting: Family Medicine

## 2014-04-01 VITALS — BP 122/82 | HR 75 | Temp 97.7°F | Ht <= 58 in | Wt 117.0 lb

## 2014-04-01 DIAGNOSIS — E039 Hypothyroidism, unspecified: Secondary | ICD-10-CM

## 2014-04-01 DIAGNOSIS — E559 Vitamin D deficiency, unspecified: Secondary | ICD-10-CM

## 2014-04-01 DIAGNOSIS — Q909 Down syndrome, unspecified: Secondary | ICD-10-CM

## 2014-04-01 DIAGNOSIS — L739 Follicular disorder, unspecified: Secondary | ICD-10-CM | POA: Insufficient documentation

## 2014-04-01 DIAGNOSIS — Z0000 Encounter for general adult medical examination without abnormal findings: Secondary | ICD-10-CM

## 2014-04-01 LAB — LIPID PANEL
CHOL/HDL RATIO: 5
CHOLESTEROL: 195 mg/dL (ref 0–200)
HDL: 42.6 mg/dL (ref >39.00)
LDL Cholesterol: 135 mg/dL — ABNORMAL HIGH (ref 0–99)
NonHDL: 152.4
TRIGLYCERIDES: 89 mg/dL (ref 0.0–149.0)
VLDL: 17.8 mg/dL (ref 0.0–40.0)

## 2014-04-01 LAB — BASIC METABOLIC PANEL
BUN: 13 mg/dL (ref 6–23)
CHLORIDE: 104 meq/L (ref 96–112)
CO2: 20 mEq/L (ref 19–32)
Calcium: 9.8 mg/dL (ref 8.4–10.5)
Creatinine, Ser: 0.7 mg/dL (ref 0.4–1.2)
GFR: 106.61 mL/min (ref >60.00)
GLUCOSE: 89 mg/dL (ref 70–99)
Potassium: 4.2 mEq/L (ref 3.5–5.1)
Sodium: 136 mEq/L (ref 135–145)

## 2014-04-01 LAB — T4, FREE: Free T4: 1.13 ng/dL (ref 0.60–1.60)

## 2014-04-01 LAB — VITAMIN D 25 HYDROXY (VIT D DEFICIENCY, FRACTURES): VITD: 23.03 ng/mL — ABNORMAL LOW (ref 30.00–100.00)

## 2014-04-01 LAB — TSH: TSH: 0.55 u[IU]/mL (ref 0.35–4.50)

## 2014-04-01 MED ORDER — MUPIROCIN 2 % EX OINT: 1.0000 "application " | TOPICAL_OINTMENT | Freq: Two times a day (BID) | CUTANEOUS | Status: DC

## 2014-04-01 NOTE — Patient Instructions (Addendum)
Good to see you today, call us with questions. For spots in private area - may use antibiotic cream provided today. Let us know if not improving. Thyroid check today. Return as needed or in 1 year for next physical.

## 2014-04-01 NOTE — Assessment & Plan Note (Signed)
Treat with mupirocin cream. Update if not improved.

## 2014-04-01 NOTE — Assessment & Plan Note (Addendum)
Preventative protocols reviewed and updated unless pt declined. Discussed healthy diet and lifestyle. Unsuccessful pap smear. Offered referral to GYN vs retrial next year. Caregiver will consider.

## 2014-04-01 NOTE — Addendum Note (Signed)
Addended by: Ria Bush on: 04/01/2014 02:20 PM   Modules accepted: Miquel Dunn

## 2014-04-01 NOTE — Assessment & Plan Note (Signed)
Check TSH today

## 2014-04-01 NOTE — Progress Notes (Addendum)
BP 122/82 mmHg  Pulse 75  Temp(Src) 97.7 F (36.5 C) (Oral)  Ht 4' 9.5" (1.461 m)  Wt 117 lb (53.071 kg)  BMI 24.86 kg/m2  SpO2 98%  LMP 03/09/2014   CC: CPE with pap  Subjective:    Patient ID: Teresa Zhang, female    DOB: 04-25-1990, 23 y.o.   MRN: [id number]  HPI: Teresa Zhang is a 23 y.o. female presenting on 04/01/2014 for Annual Exam   Patient with down's syndrome, hypothyroidism, and seasonal allergies and HSV1 infection presents for annual exam. Presents with caregivers including Avie Echevaria on private area that are itchy over last 2 wks.   Preventative: Declines flu shot.  Td 2009  Lives at [facility]. Caregiver Chrys Racer Down's Syndrome Activity: goes to gym (yoga, swimming, running) Diet: good water, fruits/vegetables daily  Relevant past medical, surgical, family and social history reviewed and updated as indicated. Interim medical history since our last visit reviewed. Allergies and medications reviewed and updated.  Current Outpatient Prescriptions on File Prior to Visit  Medication Sig  . cetirizine (ZYRTEC) 10 MG tablet Take 10 mg by mouth daily.  Marland Kitchen ibuprofen (ADVIL,MOTRIN) 200 MG tablet Take 200 mg by mouth as needed.  Marland Kitchen levothyroxine (SYNTHROID, LEVOTHROID) 125 MCG tablet Take 1 tablet (125 mcg total) by mouth daily before breakfast.  . LORazepam (ATIVAN) 0.5 MG tablet Take 1 tablet (0.5 mg total) by mouth every 8 (eight) hours as needed for anxiety.  . fluticasone (FLONASE) 50 MCG/ACT nasal spray Place 2 sprays into both nostrils daily. (Patient not taking: Reported on 04/01/2014)   No current facility-administered medications on file prior to visit.    Review of Systems  Constitutional: Negative for fever, chills, activity change, appetite change, fatigue and unexpected weight change.  HENT: Negative for hearing loss.   Eyes: Negative for visual disturbance.  Respiratory: Negative for cough, chest tightness, shortness of breath and  wheezing.   Cardiovascular: Negative for chest pain, palpitations and leg swelling.  Gastrointestinal: Negative for nausea, vomiting, abdominal pain, diarrhea, constipation, blood in stool and abdominal distention.  Genitourinary: Negative for hematuria and difficulty urinating.  Musculoskeletal: Negative for myalgias, arthralgias and neck pain.  Skin: Negative for rash.  Neurological: Negative for dizziness, seizures, syncope and headaches.  Hematological: Negative for adenopathy. Does not bruise/bleed easily.  Psychiatric/Behavioral: Negative for dysphoric mood. The patient is not nervous/anxious.    Per HPI unless specifically indicated above     Objective:    BP 122/82 mmHg  Pulse 75  Temp(Src) 97.7 F (36.5 C) (Oral)  Ht 4' 9.5" (1.461 m)  Wt 117 lb (53.071 kg)  BMI 24.86 kg/m2  SpO2 98%  LMP 03/09/2014  Wt Readings from Last 3 Encounters:  04/01/14 117 lb (53.071 kg)  10/27/13 112 lb (50.803 kg)  09/24/13 111 lb 4 oz (50.463 kg)    Physical Exam  Constitutional: She is oriented to person, place, and time. She appears well-developed and well-nourished. No distress.  HENT:  Head: Normocephalic and atraumatic.  Right Ear: Hearing, tympanic membrane, external ear and ear canal normal.  Left Ear: Hearing, tympanic membrane, external ear and ear canal normal.  Nose: Nose normal.  Mouth/Throat: Uvula is midline, oropharynx is clear and moist and mucous membranes are normal. No oropharyngeal exudate, posterior oropharyngeal edema or posterior oropharyngeal erythema.  Eyes: Conjunctivae and EOM are normal. Pupils are equal, round, and reactive to light. No scleral icterus.  Neck: Normal range of motion. Neck supple. No thyromegaly present.  Cardiovascular:  Normal rate, regular rhythm, normal heart sounds and intact distal pulses.   No murmur heard. Pulses:      Radial pulses are 2+ on the right side, and 2+ on the left side.  Pulmonary/Chest: Effort normal and breath sounds  normal. No respiratory distress. She has no wheezes. She has no rales.  Abdominal: Soft. Bowel sounds are normal. She exhibits no distension and no mass. There is no tenderness. There is no rebound and no guarding.  Genitourinary: Pelvic exam was performed with patient supine. There is no rash, tenderness, lesion or injury on the right labia. There is no rash, tenderness, lesion or injury on the left labia. No erythema in the vagina. No vaginal discharge found.  Pap exam attempted, unsuccessful. Not performed 2/2 pt anxiety/distress. External exam - small slightly erythematous nodules present external perineal area on right without tenderness/fluctuance  Musculoskeletal: Normal range of motion. She exhibits no edema.  Lymphadenopathy:    She has no cervical adenopathy.       Right: No inguinal adenopathy present.       Left: No inguinal adenopathy present.  Neurological: She is alert and oriented to person, place, and time.  CN grossly intact, station and gait intact  Skin: Skin is warm and dry. No rash noted.  Psychiatric: She has a normal mood and affect. Her behavior is normal. Judgment and thought content normal.  Nursing note and vitals reviewed.      Assessment & Plan:   Problem List Items Addressed This Visit    Hypothyroidism    Check TSH today.    Relevant Orders      TSH      T4, free   Health maintenance examination - Primary    Preventative protocols reviewed and updated unless pt declined. Discussed healthy diet and lifestyle. Unsuccessful pap smear. Offered referral to GYN vs retrial next year. Caregiver will consider.    Relevant Orders      Basic metabolic panel      Lipid panel   Folliculitis    Treat with mupirocin cream. Update if not improved.    Down's syndrome    Stable period, lives at [facility]     Other Visit Diagnoses    Vitamin D deficiency        Relevant Orders       Vit D  25 hydroxy (rtn osteoporosis monitoring)        Follow up  plan: Return as needed, for annual exam, prior fasting for blood work.

## 2014-04-01 NOTE — Progress Notes (Signed)
Pre visit review using our clinic review tool, if applicable. No additional management support is needed unless otherwise documented below in the visit note. 

## 2014-04-01 NOTE — Assessment & Plan Note (Signed)
Stable period, lives at [facility]

## 2014-04-03 ENCOUNTER — Other Ambulatory Visit: Payer: Self-pay | Admitting: Family Medicine

## 2014-04-03 MED ORDER — LEVOTHYROXINE SODIUM 125 MCG PO TABS: 125.0000 ug | ORAL_TABLET | Freq: Every day | ORAL | Status: DC

## 2014-04-03 MED ORDER — VITAMIN D3 25 MCG (1000 UT) PO CAPS: 1.0000 | ORAL_CAPSULE | Freq: Every day | ORAL | Status: AC

## 2014-04-05 ENCOUNTER — Encounter: Payer: Self-pay | Admitting: *Deleted

## 2014-05-24 ENCOUNTER — Ambulatory Visit (INDEPENDENT_AMBULATORY_CARE_PROVIDER_SITE_OTHER): Payer: BC Managed Care – PPO | Admitting: Family Medicine

## 2014-05-24 ENCOUNTER — Encounter: Payer: Self-pay | Admitting: Family Medicine

## 2014-05-24 VITALS — BP 110/70 | HR 64 | Temp 97.4°F | Wt 125.5 lb

## 2014-05-24 DIAGNOSIS — B351 Tinea unguium: Secondary | ICD-10-CM

## 2014-05-24 DIAGNOSIS — H6121 Impacted cerumen, right ear: Secondary | ICD-10-CM

## 2014-05-24 DIAGNOSIS — L03031 Cellulitis of right toe: Secondary | ICD-10-CM | POA: Insufficient documentation

## 2014-05-24 MED ORDER — UNDECYLENIC ACID 25 % EX SOLN: CUTANEOUS | Status: AC

## 2014-05-24 MED ORDER — DOXYCYCLINE HYCLATE 100 MG PO CAPS: 100.0000 mg | ORAL_CAPSULE | Freq: Two times a day (BID) | ORAL | Status: DC

## 2014-05-24 MED ORDER — DOCUSATE SODIUM 150 MG/15ML PO LIQD: ORAL | Status: AC

## 2014-05-24 NOTE — Patient Instructions (Signed)
For toe - I think there's beginning of an infection - treat with doxycycline twice daily for 10 days. For nail fungus - start fungi-nail over the counter lacquer daily. This will take 8-12 months of regular use to affect change. Next step if not effective is oral antifungal medication - let me know if you'd like to try this

## 2014-05-24 NOTE — Progress Notes (Signed)
Pre visit review using our clinic review tool, if applicable. No additional management support is needed unless otherwise documented below in the visit note. 

## 2014-05-24 NOTE — Assessment & Plan Note (Signed)
Requests topical colace trial sent in.

## 2014-05-24 NOTE — Assessment & Plan Note (Signed)
Discussed options - will treat with funginail. Discussed anticipated long course of resolution.  Caregiver wants to avoid oral med due to systemic side effects.

## 2014-05-24 NOTE — Progress Notes (Signed)
BP 110/70 mmHg  Pulse 64  Temp(Src) 97.4 F (36.3 C) (Oral)  Wt 125 lb 8 oz (56.926 kg)  LMP 05/02/2014 (Approximate)   CC: check R foot/toes  Subjective:    Patient ID: Teresa Zhang, female    DOB: 09-21-1990, 24 y.o.   MRN: [id number]  HPI: Teresa Zhang is a 25 y.o. female presenting on 05/24/2014 for Nail Problem   Presents with [facility] Sauer caregiver.   Chronic fungal toenail infection.   Last weekend at home with mom who cut her toenails, since then middle toe on right more erythematous and tender.    Has been soaking toe in epsom salt.   She has been running. She also swims for special olympics.  No fevers/chills.   Relevant past medical, surgical, family and social history reviewed and updated as indicated. Interim medical history since our last visit reviewed. Allergies and medications reviewed and updated. Current Outpatient Prescriptions on File Prior to Visit  Medication Sig  . cetirizine (ZYRTEC) 10 MG tablet Take 10 mg by mouth daily.  Marland Kitchen ibuprofen (ADVIL,MOTRIN) 200 MG tablet Take 200 mg by mouth as needed.  Marland Kitchen levothyroxine (SYNTHROID, LEVOTHROID) 125 MCG tablet Take 1 tablet (125 mcg total) by mouth daily before breakfast.  . LORazepam (ATIVAN) 0.5 MG tablet Take 1 tablet (0.5 mg total) by mouth every 8 (eight) hours as needed for anxiety.  . mupirocin ointment (BACTROBAN) 2 % Apply 1 application topically 2 (two) times daily. To bumps on private area  . Cholecalciferol (VITAMIN D3) 1000 UNITS CAPS Take 1 capsule (1,000 Units total) by mouth daily. (Patient not taking: Reported on 05/24/2014)  . fluticasone (FLONASE) 50 MCG/ACT nasal spray Place 2 sprays into both nostrils daily. (Patient not taking: Reported on 04/01/2014)   No current facility-administered medications on file prior to visit.    Review of Systems Per HPI unless specifically indicated above     Objective:    BP 110/70 mmHg  Pulse 64  Temp(Src) 97.4 F (36.3 C) (Oral)  Wt 125 lb 8 oz  (56.926 kg)  LMP 05/02/2014 (Approximate)  Wt Readings from Last 3 Encounters:  05/24/14 125 lb 8 oz (56.926 kg)  04/01/14 117 lb (53.071 kg)  10/27/13 112 lb (50.803 kg)    Physical Exam  Constitutional: She appears well-developed and well-nourished. No distress.  Musculoskeletal: She exhibits no edema.       Feet:  2+ DP R Onychomycotic nails. R mid toe with erythema at medial base of nail, tender to palpation. No evident fluctuance or pus however.  Nursing note and vitals reviewed.  Results for orders placed or performed in visit on 32/44/01  Basic metabolic panel  Result Value Ref Range   Sodium 136 135 - 145 mEq/L   Potassium 4.2 3.5 - 5.1 mEq/L   Chloride 104 96 - 112 mEq/L   CO2 20 19 - 32 mEq/L   Glucose, Bld 89 70 - 99 mg/dL   BUN 13 6 - 23 mg/dL   Creatinine, Ser 0.7 0.4 - 1.2 mg/dL   Calcium 9.8 8.4 - 10.5 mg/dL   GFR 106.61 >60.00 mL/min  TSH  Result Value Ref Range   TSH 0.55 0.35 - 4.50 uIU/mL  Lipid panel  Result Value Ref Range   Cholesterol 195 0 - 200 mg/dL   Triglycerides 89.0 0.0 - 149.0 mg/dL   HDL 42.60 >39.00 mg/dL   VLDL 17.8 0.0 - 40.0 mg/dL   LDL Cholesterol 135 (H) 0 - 99 mg/dL   Total  CHOL/HDL Ratio 5    NonHDL 152.40   T4, free  Result Value Ref Range   Free T4 1.13 0.60 - 1.60 ng/dL  Vit D  25 hydroxy (rtn osteoporosis monitoring)  Result Value Ref Range   VITD 23.03 (L) 30.00 - 100.00 ng/mL      Assessment & Plan:   Problem List Items Addressed This Visit    Paronychia of third toe of right foot - Primary    Without evident pus pocket that needs drainage today. Will treat with doxycycline. Offered referral to podiatry for nail care.      Relevant Medications   Undecylenic Acid (FUNGI-NAIL) 25 % SOLN   Onychomycosis    Discussed options - will treat with funginail. Discussed anticipated long course of resolution.  Caregiver wants to avoid oral med due to systemic side effects.      Relevant Medications   Undecylenic Acid  (FUNGI-NAIL) 25 % SOLN   Cerumen impaction    Requests topical colace trial sent in.          Follow up plan: Return if symptoms worsen or fail to improve.

## 2014-05-24 NOTE — Assessment & Plan Note (Signed)
Without evident pus pocket that needs drainage today. Will treat with doxycycline. Offered referral to podiatry for nail care.

## 2014-10-12 ENCOUNTER — Telehealth: Payer: Self-pay

## 2014-10-12 NOTE — Telephone Encounter (Signed)
Teresa Zhang home coordinator left v/m; earlier today tried to remove tick and only part of tick was removed. What to do? Called Teresa Zhang back and he said pt was at [facility] walk in now to get taken care of. Will cb if anything further needed.

## 2014-10-26 ENCOUNTER — Encounter: Payer: Self-pay | Admitting: Primary Care

## 2014-10-26 ENCOUNTER — Ambulatory Visit (INDEPENDENT_AMBULATORY_CARE_PROVIDER_SITE_OTHER): Payer: BC Managed Care – PPO | Admitting: Primary Care

## 2014-10-26 VITALS — BP 110/76 | HR 63 | Temp 98.2°F | Wt 113.0 lb

## 2014-10-26 DIAGNOSIS — L0291 Cutaneous abscess, unspecified: Secondary | ICD-10-CM

## 2014-10-26 MED ORDER — DOXYCYCLINE HYCLATE 100 MG PO TABS
100.0000 mg | ORAL_TABLET | Freq: Two times a day (BID) | ORAL | Status: DC
Start: 2014-10-26 — End: 2015-04-19

## 2014-10-26 NOTE — Progress Notes (Signed)
Subjective:    Patient ID: Teresa Zhang, female    DOB: 02-10-1991, 24 y.o.   MRN: [id number]  HPI  Teresa Zhang is a 24 year old female who presents today with a chief complaint of ear pain and rash.  1) Ear pain: Present to right ear for the past 2 days. The pain is worse when listening to her headphones. Teresa Zhang's been complaining of sensitivity to sound. Denies fevers, chills, cough, weakness.  2) Rash: Present to right groin that her mother noticed 3 months ago. Since 3 months ago her rash has not dissipated. Teresa Zhang reports minor itching. Denies pain. Teresa Zhang's applied A&D ointment without relief  Review of Systems  Constitutional: Negative for fever and chills.  HENT: Positive for ear pain. Negative for congestion and rhinorrhea.   Respiratory: Negative for cough and shortness of breath.   Skin: Positive for rash.       Past Medical History  Diagnosis Date  . Down's syndrome   . Seasonal allergies   . Hypothyroidism   . HSV-1 infection     History   Social History  . Marital Status: Single    Spouse Name: N/A  . Number of Children: N/A  . Years of Education: N/A   Occupational History  . Not on file.   Social History Main Topics  . Smoking status: Never Smoker   . Smokeless tobacco: Never Used  . Alcohol Use: No  . Drug Use: No  . Sexual Activity: Not on file   Other Topics Concern  . Not on file   Social History Narrative   Lives at [facility] Caregiver Chrys Racer   Down's Syndrome   Activity: goes to gym (yoga, swimming, running)   Teresa Zhang runs. 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   Diet: good water, fruits/vegetables daily    Past Surgical History  Procedure Laterality Date  . No past surgeries      Family History  Problem Relation Age of Onset  . Cancer Other     colon, great uncle  . Cancer Other     prostate, grandparent  . Hypertension Father   . Hyperlipidemia Father   . Hashimoto's thyroiditis Sister   . Cancer Father     throat    No Known  Allergies  Current Outpatient Prescriptions on File Prior to Visit  Medication Sig Dispense Refill  . cetirizine (ZYRTEC) 10 MG tablet Take 10 mg by mouth daily.    . Cholecalciferol (VITAMIN D3) 1000 UNITS CAPS Take 1 capsule (1,000 Units total) by mouth daily. 30 capsule   . ibuprofen (ADVIL,MOTRIN) 200 MG tablet Take 200 mg by mouth as needed.    Marland Kitchen levothyroxine (SYNTHROID, LEVOTHROID) 125 MCG tablet Take 1 tablet (125 mcg total) by mouth daily before breakfast. 90 tablet 3  . mupirocin ointment (BACTROBAN) 2 % Apply 1 application topically 2 (two) times daily. To bumps on private area 30 g 0  . Undecylenic Acid (FUNGI-NAIL) 25 % SOLN Apply to affected area daily on toes    . Docusate Sodium 150 MG/15ML syrup Apply 1 cc into affected ear. If no clearing, flush with saline (Patient not taking: Reported on 10/26/2014) 100 mL 0  . fluticasone (FLONASE) 50 MCG/ACT nasal spray Place 2 sprays into both nostrils daily. (Patient not taking: Reported on 04/01/2014) 16 g 6  . LORazepam (ATIVAN) 0.5 MG tablet Take 1 tablet (0.5 mg total) by mouth every 8 (eight) hours as needed for anxiety. (Patient not taking: Reported on 10/26/2014)  4 tablet 0   No current facility-administered medications on file prior to visit.    BP 110/76 mmHg  Pulse 63  Temp(Src) 98.2 F (36.8 C) (Oral)  Wt 113 lb (51.256 kg)  SpO2 96%  LMP 10/17/2014    Objective:   Physical Exam  Constitutional: Teresa Zhang appears well-nourished. Teresa Zhang does not appear ill.  HENT:  Right Ear: Tympanic membrane normal.  Left Ear: Tympanic membrane and ear canal normal.  Nose: Nose normal.  Mouth/Throat: Oropharynx is clear and moist.  Cerumen impaction. TM post irrigation WNL.  Eyes: Conjunctivae are normal. Pupils are equal, round, and reactive to light.  Neck: Neck supple.  Cardiovascular: Normal rate and regular rhythm.   Pulmonary/Chest: Effort normal and breath sounds normal.  Lymphadenopathy:    Teresa Zhang has no cervical adenopathy.    Skin: Skin is warm and dry.  2 small abscesses noted to right labia majora. Intact. Appear to be due to ingrown hair follicle and appears slightly irritated. No drainage.          Assessment & Plan:  Abscess:  2 small < or = 0.5 cm to right labia majora. Appear to be ingrown hair follicle with irritation. Due to duration and presentation will treat with Doxycycline.  Instructions provided to keep area clean and dry.  Ear pain:   Small cerumen impaction present to right canal without visualization of TM. Attempted to remove with plastic curette, unsuccessful. Removal of cerumen was successful with irrigation. TM post irrigation WNL.

## 2014-10-26 NOTE — Progress Notes (Signed)
Pre visit review using our clinic review tool, if applicable. No additional management support is needed unless otherwise documented below in the visit note. 

## 2014-10-26 NOTE — Patient Instructions (Signed)
Start Doxycycline antibiotics for skin infection. Take 1 tablet by mouth twice daily for 10 days. Keep area clean and dry.  Please notify me if no improvement once the antibiotics are complete.

## 2014-12-10 ENCOUNTER — Ambulatory Visit (INDEPENDENT_AMBULATORY_CARE_PROVIDER_SITE_OTHER): Payer: Self-pay | Admitting: Internal Medicine

## 2014-12-10 ENCOUNTER — Encounter: Payer: Self-pay | Admitting: Internal Medicine

## 2014-12-10 ENCOUNTER — Ambulatory Visit: Payer: BC Managed Care – PPO | Admitting: Internal Medicine

## 2014-12-10 NOTE — Progress Notes (Deleted)
Subjective:    Patient ID: Teresa Zhang, female    DOB: 01/19/1991, 24 y.o.   MRN: [id number]  HPI  Pt presents to the clinic today with c/o abdominal pain. This started.  Review of Systems      Past Medical History  Diagnosis Date  . Down's syndrome   . Seasonal allergies   . Hypothyroidism   . HSV-1 infection     Current Outpatient Prescriptions  Medication Sig Dispense Refill  . ascorbic acid (VITAMIN C) 500 MG/5ML syrup Take 100 mg by mouth daily.    . cetirizine (ZYRTEC) 10 MG tablet Take 10 mg by mouth daily.    . Cholecalciferol (VITAMIN D3) 1000 UNITS CAPS Take 1 capsule (1,000 Units total) by mouth daily. 30 capsule   . Docusate Sodium 150 MG/15ML syrup Apply 1 cc into affected ear. If no clearing, flush with saline 100 mL 0  . doxycycline (VIBRA-TABS) 100 MG tablet Take 1 tablet (100 mg total) by mouth 2 (two) times daily. 20 tablet 0  . fluticasone (FLONASE) 50 MCG/ACT nasal spray Place 2 sprays into both nostrils daily. 16 g 6  . ibuprofen (ADVIL,MOTRIN) 200 MG tablet Take 200 mg by mouth as needed.    Marland Kitchen levothyroxine (SYNTHROID, LEVOTHROID) 125 MCG tablet Take 1 tablet (125 mcg total) by mouth daily before breakfast. 90 tablet 3  . LORazepam (ATIVAN) 0.5 MG tablet Take 1 tablet (0.5 mg total) by mouth every 8 (eight) hours as needed for anxiety. 4 tablet 0  . mupirocin ointment (BACTROBAN) 2 % Apply 1 application topically 2 (two) times daily. To bumps on private area 30 g 0  . Undecylenic Acid (FUNGI-NAIL) 25 % SOLN Apply to affected area daily on toes     No current facility-administered medications for this visit.    No Known Allergies  Family History  Problem Relation Age of Onset  . Cancer Other     colon, great uncle  . Cancer Other     prostate, grandparent  . Hypertension Father   . Hyperlipidemia Father   . Hashimoto's thyroiditis Sister   . Cancer Father     throat    Social History   Social History  . Marital Status: Single    Spouse  Name: N/A  . Number of Children: N/A  . Years of Education: N/A   Occupational History  . Not on file.   Social History Main Topics  . Smoking status: Never Smoker   . Smokeless tobacco: Never Used  . Alcohol Use: No  . Drug Use: No  . Sexual Activity: Not on file   Other Topics Concern  . Not on file   Social History Narrative   Lives at [facility] Caregiver Chrys Racer   Down's Syndrome   Activity: goes to gym (yoga, swimming, running)   She runs. She also swims for special olympics.   Diet: good water, fruits/vegetables daily     Constitutional: Denies fever, malaise, fatigue, headache or abrupt weight changes.  HEENT: Denies eye pain, eye redness, ear pain, ringing in the ears, wax buildup, runny nose, nasal congestion, bloody nose, or sore throat. Respiratory: Denies difficulty breathing, shortness of breath, cough or sputum production.   Cardiovascular: Denies chest pain, chest tightness, palpitations or swelling in the hands or feet.  Gastrointestinal: Pt reports abdominal pain. Denies bloating, constipation, diarrhea or blood in the stool.  GU: Denies urgency, frequency, pain with urination, burning sensation, blood in urine, odor or discharge. Musculoskeletal: Denies decrease in  range of motion, difficulty with gait, muscle pain or joint pain and swelling.  Skin: Denies redness, rashes, lesions or ulcercations.  Neurological: Denies dizziness, difficulty with memory, difficulty with speech or problems with balance and coordination.   No other specific complaints in a complete review of systems (except as listed in HPI above).  Objective:   Physical Exam        Assessment & Plan:

## 2014-12-10 NOTE — Progress Notes (Signed)
Pre visit review using our clinic review tool, if applicable. No additional management support is needed unless otherwise documented below in the visit note. 

## 2015-02-15 ENCOUNTER — Telehealth: Payer: Self-pay | Admitting: Family Medicine

## 2015-02-15 NOTE — Telephone Encounter (Signed)
Mom called wanting to talk to someone about Teresa Zhang's health insurance and making sure she can be seen at the office here

## 2015-02-16 NOTE — Telephone Encounter (Signed)
Left message explaining that new medicaid procedures starting 04/17/2015 that yes we can see pt at this time.  Pt's mom instructed to call if any questions / lt

## 2015-03-30 ENCOUNTER — Other Ambulatory Visit: Payer: Medicaid Other

## 2015-04-04 ENCOUNTER — Encounter: Payer: Medicaid Other | Admitting: Family Medicine

## 2015-04-07 ENCOUNTER — Other Ambulatory Visit: Payer: Self-pay | Admitting: Family Medicine

## 2015-04-19 ENCOUNTER — Ambulatory Visit (INDEPENDENT_AMBULATORY_CARE_PROVIDER_SITE_OTHER): Payer: BC Managed Care – PPO | Admitting: Family Medicine

## 2015-04-19 ENCOUNTER — Encounter: Payer: Self-pay | Admitting: Family Medicine

## 2015-04-19 VITALS — BP 108/72 | HR 88 | Temp 97.8°F | Ht <= 58 in | Wt 110.5 lb

## 2015-04-19 DIAGNOSIS — B351 Tinea unguium: Secondary | ICD-10-CM

## 2015-04-19 DIAGNOSIS — D171 Benign lipomatous neoplasm of skin and subcutaneous tissue of trunk: Secondary | ICD-10-CM

## 2015-04-19 DIAGNOSIS — N946 Dysmenorrhea, unspecified: Secondary | ICD-10-CM | POA: Diagnosis not present

## 2015-04-19 DIAGNOSIS — E559 Vitamin D deficiency, unspecified: Secondary | ICD-10-CM

## 2015-04-19 DIAGNOSIS — Z0000 Encounter for general adult medical examination without abnormal findings: Secondary | ICD-10-CM

## 2015-04-19 DIAGNOSIS — Q909 Down syndrome, unspecified: Secondary | ICD-10-CM | POA: Diagnosis not present

## 2015-04-19 DIAGNOSIS — E039 Hypothyroidism, unspecified: Secondary | ICD-10-CM

## 2015-04-19 DIAGNOSIS — K130 Diseases of lips: Secondary | ICD-10-CM

## 2015-04-19 DIAGNOSIS — J302 Other seasonal allergic rhinitis: Secondary | ICD-10-CM

## 2015-04-19 DIAGNOSIS — R634 Abnormal weight loss: Secondary | ICD-10-CM

## 2015-04-19 NOTE — Patient Instructions (Signed)
Blood work today.  We will refer you to GYN to discuss painful periods. Try mupirocin for lips and let me know if not better. Return as needed or in 6 months for follow up on weight. Good to see you today! Call us with questions.  Health Maintenance, Female Adopting a healthy lifestyle and getting preventive care can go a long way to promote health and wellness. Talk with your health care provider about what schedule of regular examinations is right for you. This is a good chance for you to check in with your provider about disease prevention and staying healthy. In between checkups, there are plenty of things you can do on your own. Experts have done a lot of research about which lifestyle changes and preventive measures are most likely to keep you healthy. Ask your health care provider for more information. WEIGHT AND DIET  Eat a healthy diet  Be sure to include plenty of vegetables, fruits, low-fat dairy products, and lean protein.  Do not eat a lot of foods high in solid fats, added sugars, or salt.  Get regular exercise. This is one of the most important things you can do for your health.  Most adults should exercise for at least 150 minutes each week. The exercise should increase your heart rate and make you sweat (moderate-intensity exercise).  Most adults should also do strengthening exercises at least twice a week. This is in addition to the moderate-intensity exercise.  Maintain a healthy weight  Body mass index (BMI) is a measurement that can be used to identify possible weight problems. It estimates body fat based on height and weight. Your health care provider can help determine your BMI and help you achieve or maintain a healthy weight.  For females 58 years of age and older:   A BMI below 18.5 is considered underweight.  A BMI of 18.5 to 24.9 is normal.  A BMI of 25 to 29.9 is considered overweight.  A BMI of 30 and above is considered obese.  Watch levels of  cholesterol and blood lipids  You should start having your blood tested for lipids and cholesterol at 27 years of age, then have this test every 5 years.  You may need to have your cholesterol levels checked more often if:  Your lipid or cholesterol levels are high.  You are older than 27 years of age.  You are at high risk for heart disease.  CANCER SCREENING   Lung Cancer  Lung cancer screening is recommended for adults 6-73 years old who are at high risk for lung cancer because of a history of smoking.  A yearly low-dose CT scan of the lungs is recommended for people who:  Currently smoke.  Have quit within the past 15 years.  Have at least a 30-pack-year history of smoking. A pack year is smoking an average of one pack of cigarettes a day for 1 year.  Yearly screening should continue until it has been 15 years since you quit.  Yearly screening should stop if you develop a health problem that would prevent you from having lung cancer treatment.  Breast Cancer  Practice breast self-awareness. This means understanding how your breasts normally appear and feel.  It also means doing regular breast self-exams. Let your health care provider know about any changes, no matter how small.  If you are in your 20s or 30s, you should have a clinical breast exam (CBE) by a health care provider every 1-3 years as part of  a regular health exam.  If you are 27 or older, have a CBE every year. Also consider having a breast X-ray (mammogram) every year.  If you have a family history of breast cancer, talk to your health care provider about genetic screening.  If you are at high risk for breast cancer, talk to your health care provider about having an MRI and a mammogram every year.  Breast cancer gene (BRCA) assessment is recommended for women who have family members with BRCA-related cancers. BRCA-related cancers include:  Breast.  Ovarian.  Tubal.  Peritoneal  cancers.  Results of the assessment will determine the need for genetic counseling and BRCA1 and BRCA2 testing. Cervical Cancer Your health care provider may recommend that you be screened regularly for cancer of the pelvic organs (ovaries, uterus, and vagina). This screening involves a pelvic examination, including checking for microscopic changes to the surface of your cervix (Pap test). You may be encouraged to have this screening done every 3 years, beginning at age 24.  For women ages 65-65, health care providers may recommend pelvic exams and Pap testing every 3 years, or they may recommend the Pap and pelvic exam, combined with testing for human papilloma virus (HPV), every 5 years. Some types of HPV increase your risk of cervical cancer. Testing for HPV may also be done on women of any age with unclear Pap test results.  Other health care providers may not recommend any screening for nonpregnant women who are considered low risk for pelvic cancer and who do not have symptoms. Ask your health care provider if a screening pelvic exam is right for you.  If you have had past treatment for cervical cancer or a condition that could lead to cancer, you need Pap tests and screening for cancer for at least 20 years after your treatment. If Pap tests have been discontinued, your risk factors (such as having a new sexual partner) need to be reassessed to determine if screening should resume. Some women have medical problems that increase the chance of getting cervical cancer. In these cases, your health care provider may recommend more frequent screening and Pap tests. Colorectal Cancer  This type of cancer can be detected and often prevented.  Routine colorectal cancer screening usually begins at 27 years of age and continues through 27 years of age.  Your health care provider may recommend screening at an earlier age if you have risk factors for colon cancer.  Your health care provider may also  recommend using home test kits to check for hidden blood in the stool.  A small camera at the end of a tube can be used to examine your colon directly (sigmoidoscopy or colonoscopy). This is done to check for the earliest forms of colorectal cancer.  Routine screening usually begins at age 10.  Direct examination of the colon should be repeated every 5-10 years through 27 years of age. However, you may need to be screened more often if early forms of precancerous polyps or small growths are found. Skin Cancer  Check your skin from head to toe regularly.  Tell your health care provider about any new moles or changes in moles, especially if there is a change in a mole's shape or color.  Also tell your health care provider if you have a mole that is larger than the size of a pencil eraser.  Always use sunscreen. Apply sunscreen liberally and repeatedly throughout the day.  Protect yourself by wearing long sleeves, pants, a wide-brimmed  hat, and sunglasses whenever you are outside. HEART DISEASE, DIABETES, AND HIGH BLOOD PRESSURE   High blood pressure causes heart disease and increases the risk of stroke. High blood pressure is more likely to develop in:  People who have blood pressure in the high end of the normal range (130-139/85-89 mm Hg).  People who are overweight or obese.  People who are African American.  If you are 42-48 years of age, have your blood pressure checked every 3-5 years. If you are 103 years of age or older, have your blood pressure checked every year. You should have your blood pressure measured twice--once when you are at a hospital or clinic, and once when you are not at a hospital or clinic. Record the average of the two measurements. To check your blood pressure when you are not at a hospital or clinic, you can use:  An automated blood pressure machine at a pharmacy.  A home blood pressure monitor.  If you are between 39 years and 54 years old, ask your health  care provider if you should take aspirin to prevent strokes.  Have regular diabetes screenings. This involves taking a blood sample to check your fasting blood sugar level.  If you are at a normal weight and have a low risk for diabetes, have this test once every three years after 27 years of age.  If you are overweight and have a high risk for diabetes, consider being tested at a younger age or more often. PREVENTING INFECTION  Hepatitis B  If you have a higher risk for hepatitis B, you should be screened for this virus. You are considered at high risk for hepatitis B if:  You were born in a country where hepatitis B is common. Ask your health care provider which countries are considered high risk.  Your parents were born in a high-risk country, and you have not been immunized against hepatitis B (hepatitis B vaccine).  You have HIV or AIDS.  You use needles to inject street drugs.  You live with someone who has hepatitis B.  You have had sex with someone who has hepatitis B.  You get hemodialysis treatment.  You take certain medicines for conditions, including cancer, organ transplantation, and autoimmune conditions. Hepatitis C  Blood testing is recommended for:  Everyone born from [birth year] through 1967.  Anyone with known risk factors for hepatitis C. Sexually transmitted infections (STIs)  You should be screened for sexually transmitted infections (STIs) including gonorrhea and chlamydia if:  You are sexually active and are younger than 27 years of age.  You are older than 27 years of age and your health care provider tells you that you are at risk for this type of infection.  Your sexual activity has changed since you were last screened and you are at an increased risk for chlamydia or gonorrhea. Ask your health care provider if you are at risk.  If you do not have HIV, but are at risk, it may be recommended that you take a prescription medicine daily to prevent HIV  infection. This is called pre-exposure prophylaxis (PrEP). You are considered at risk if:  You are sexually active and do not regularly use condoms or know the HIV status of your partner(s).  You take drugs by injection.  You are sexually active with a partner who has HIV. Talk with your health care provider about whether you are at high risk of being infected with HIV. If you choose to begin PrEP, you  should first be tested for HIV. You should then be tested every 3 months for as long as you are taking PrEP.  PREGNANCY   If you are premenopausal and you may become pregnant, ask your health care provider about preconception counseling.  If you may become pregnant, take 400 to 800 micrograms (mcg) of folic acid every day.  If you want to prevent pregnancy, talk to your health care provider about birth control (contraception). OSTEOPOROSIS AND MENOPAUSE   Osteoporosis is a disease in which the bones lose minerals and strength with aging. This can result in serious bone fractures. Your risk for osteoporosis can be identified using a bone density scan.  If you are 29 years of age or older, or if you are at risk for osteoporosis and fractures, ask your health care provider if you should be screened.  Ask your health care provider whether you should take a calcium or vitamin D supplement to lower your risk for osteoporosis.  Menopause may have certain physical symptoms and risks.  Hormone replacement therapy may reduce some of these symptoms and risks. Talk to your health care provider about whether hormone replacement therapy is right for you.  HOME CARE INSTRUCTIONS   Schedule regular health, dental, and eye exams.  Stay current with your immunizations.   Do not use any tobacco products including cigarettes, chewing tobacco, or electronic cigarettes.  If you are pregnant, do not drink alcohol.  If you are breastfeeding, limit how much and how often you drink alcohol.  Limit  alcohol intake to no more than 1 drink per day for nonpregnant women. One drink equals 12 ounces of beer, 5 ounces of wine, or 1 ounces of hard liquor.  Do not use street drugs.  Do not share needles.  Ask your health care provider for help if you need support or information about quitting drugs.  Tell your health care provider if you often feel depressed.  Tell your health care provider if you have ever been abused or do not feel safe at home.   This information is not intended to replace advice given to you by your health care provider. Make sure you discuss any questions you have with your health care provider.   Document Released: 10/16/2010 Document Revised: 04/23/2014 Document Reviewed: 03/04/2013 Elsevier Interactive Patient Education [employer].

## 2015-04-19 NOTE — Progress Notes (Signed)
Pre visit review using our clinic review tool, if applicable. No additional management support is needed unless otherwise documented below in the visit note. 

## 2015-04-19 NOTE — Progress Notes (Signed)
BP 108/72 mmHg  Pulse 88  Temp(Src) 97.8 F (36.6 C) (Oral)  Ht 4' 9.5" (1.461 m)  Wt 110 lb 8 oz (50.122 kg)  BMI 23.48 kg/m2  LMP 04/09/2015   CC: CPE  Subjective:    Patient ID: Teresa Zhang, female    DOB: October 02, 1990, 25 y.o.   MRN: [id number]  HPI: Teresa Zhang is a 25 y.o. female presenting on 04/19/2015 for Annual Exam   Presents with mom Peggy today.  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 Presents with caregivers including Chrys Racer  Taking liposomal vitamin C as well as multivitamin.   Intermittent earaches - previously on colace cerumenolytic.   Cycles becoming painful. Takes advil 400mg  Q6hrs on day 1, 200mg  Q6hrs on day 2 and then as needed. Otherwise normal periods. Regular periods. No intermenstrual spotting. Mom asks about possible ablation. Requests GYN referral.   Some trouble with sleep.  Would like lipoma on back checked.  3 tick bites this past summer. Once prescribed doxycycline by [facility] for some infection. Mom will bring records to review.   Some abdominal discomfort with weight loss noted. Complains of diarrhea and nausea. Good appetite.   Denies memory trouble.  Intermittent constipation.   Preventative: Last year we attempted pelvic exam with pap - unsuccessfully, stopped due to pt anxiety/distress. We discussed re attempt. Pt not sexually active. Will defer for now.  LMP 04/09/2015, regular.  Declines flu shot.  Td 2009.  Seat belt use discussed Sunscreen use discussed  Lives at [facility]. Caregiver Chrys Racer. Mom Peggy.  Down's Syndrome.  Activity: goes to gym (yoga, swimming, running). Diet: good water, fruits/vegetables daily   Relevant past medical, surgical, family and social history reviewed and updated as indicated. Interim medical history since our last visit reviewed. Allergies and medications reviewed and updated. Current Outpatient Prescriptions on File  Prior to Visit  Medication Sig  . ascorbic acid (VITAMIN C) 500 MG/5ML syrup Take 100 mg by mouth daily. liposomal  . cetirizine (ZYRTEC) 10 MG tablet Take 10 mg by mouth daily as needed for allergies.   Marland Kitchen ibuprofen (ADVIL,MOTRIN) 200 MG tablet Take 200 mg by mouth as needed.  Marland Kitchen levothyroxine (SYNTHROID, LEVOTHROID) 125 MCG tablet TAKE 1 TABLET BY MOUTH EVERY MORNING BEFORE BREAKFAST  . Cholecalciferol (VITAMIN D3) 1000 UNITS CAPS Take 1 capsule (1,000 Units total) by mouth daily.  Mariane Baumgarten Sodium 150 MG/15ML syrup Apply 1 cc into affected ear. If no clearing, flush with saline (Patient not taking: Reported on 04/19/2015)  . mupirocin ointment (BACTROBAN) 2 % Apply 1 application topically 2 (two) times daily. To bumps on private area (Patient not taking: Reported on 04/19/2015)  . Undecylenic Acid (FUNGI-NAIL) 25 % SOLN Apply to affected area daily on toes   No current facility-administered medications on file prior to visit.    Review of Systems  Constitutional: Positive for unexpected weight change. Negative for fever, chills, activity change, appetite change and fatigue.  HENT: Negative for hearing loss.   Eyes: Negative for visual disturbance.  Respiratory: Negative for cough, chest tightness, shortness of breath and wheezing.   Cardiovascular: Negative for chest pain, palpitations and leg swelling.  Gastrointestinal: Positive for nausea, abdominal pain and diarrhea. Negative for vomiting, constipation, blood in stool and abdominal distention.  Genitourinary: Negative for hematuria and difficulty urinating.  Musculoskeletal: Negative for myalgias, arthralgias and neck pain.  Skin: Negative for rash.  Neurological: Negative for dizziness, seizures, syncope and headaches.  Hematological: Negative for adenopathy. Does not bruise/bleed easily.  Psychiatric/Behavioral: Negative for dysphoric mood. The patient is not nervous/anxious.    Per HPI unless specifically indicated in ROS section      Objective:    BP 108/72 mmHg  Pulse 88  Temp(Src) 97.8 F (36.6 C) (Oral)  Ht 4' 9.5" (1.461 m)  Wt 110 lb 8 oz (50.122 kg)  BMI 23.48 kg/m2  LMP 04/09/2015  Wt Readings from Last 3 Encounters:  04/19/15 110 lb 8 oz (50.122 kg)  10/26/14 113 lb (51.256 kg)  05/24/14 125 lb 8 oz (56.926 kg)    Physical Exam  Constitutional: She is oriented to person, place, and time. She appears well-developed and well-nourished. No distress.  HENT:  Head: Normocephalic and atraumatic.  Right Ear: Hearing, tympanic membrane, external ear and ear canal normal.  Left Ear: Hearing, tympanic membrane, external ear and ear canal normal.  Nose: Nose normal.  Mouth/Throat: Uvula is midline, oropharynx is clear and moist and mucous membranes are normal. No oropharyngeal exudate, posterior oropharyngeal edema or posterior oropharyngeal erythema.  Cracking at lip fissures  Eyes: Conjunctivae and EOM are normal. Pupils are equal, round, and reactive to light. No scleral icterus.  Neck: Normal range of motion. Neck supple. No thyromegaly present.  Cardiovascular: Normal rate, regular rhythm, normal heart sounds and intact distal pulses.   No murmur heard. Pulses:      Radial pulses are 2+ on the right side, and 2+ on the left side.  Pulmonary/Chest: Effort normal and breath sounds normal. No respiratory distress. She has no wheezes. She has no rales.  Abdominal: Soft. Bowel sounds are normal. She exhibits no distension and no mass. There is no tenderness. There is no rebound and no guarding.  Genitourinary:  GYN - deferred  Musculoskeletal: Normal range of motion. She exhibits no edema.  Lymphadenopathy:    She has no cervical adenopathy.  Neurological: She is alert and oriented to person, place, and time.  CN grossly intact, station and gait intact  Skin: Skin is warm and dry. No rash noted.  ~4cm well circumscribed firm soft tissue swelling midline thoracic back  Psychiatric: She has a normal mood  and affect.  Nursing note and vitals reviewed.  Results for orders placed or performed in visit on [id number]  Basic metabolic panel  Result Value Ref Range   Sodium 136 135 - 145 mEq/L   Potassium 4.2 3.5 - 5.1 mEq/L   Chloride 104 96 - 112 mEq/L   CO2 20 19 - 32 mEq/L   Glucose, Bld 89 70 - 99 mg/dL   BUN 13 6 - 23 mg/dL   Creatinine, Ser 0.7 0.4 - 1.2 mg/dL   Calcium 9.8 8.4 - 10.5 mg/dL   GFR 106.61 >60.00 mL/min  TSH  Result Value Ref Range   TSH 0.55 0.35 - 4.50 uIU/mL  Lipid panel  Result Value Ref Range   Cholesterol 195 0 - 200 mg/dL   Triglycerides 89.0 0.0 - 149.0 mg/dL   HDL 42.60 >39.00 mg/dL   VLDL 17.8 0.0 - 40.0 mg/dL   LDL Cholesterol 135 (H) 0 - 99 mg/dL   Total CHOL/HDL Ratio 5    NonHDL 152.40   T4, free  Result Value Ref Range   Free T4 1.13 0.60 - 1.60 ng/dL  Vit D  25 hydroxy (rtn osteoporosis monitoring)  Result Value Ref Range   VITD 23.03 (L) 30.00 - 100.00 ng/mL      Assessment & Plan:  Problem List Items Addressed This Visit    Vitamin D deficiency    Continue 1000 IU daily.       Relevant Orders   VITAMIN D 25 Hydroxy (Vit-D Deficiency, Fractures)   Seasonal allergies    Stable on PRN claritin.      Onychomycosis    Discussed treatment options - continue funginail.      Loss of weight    Weight loss noted. abd pain endorsed. ?hunger. Very active, just ran 5K. Mom plans to have healthy diet options available.       Relevant Orders   Comprehensive metabolic panel   CBC with Differential/Platelet   Lipoma of back    Reassured. Continue to monitor.      Hypothyroidism    Check labs. Continue levothyroxine.      Relevant Orders   TSH   T4, free   Health maintenance examination - Primary    Preventative protocols reviewed and updated unless pt declined. Discussed healthy diet and lifestyle.  Did not attempt GYN exam. Unsuccessful pap smear last year.       Dysmenorrhea    Currently controlled with scheduled ibuprofen.    Mom requests GYN referral - would like to discuss possible ablation procedure.       Relevant Orders   Ambulatory referral to Gynecology   Down's syndrome    Stable period. Lives at [facility].       Relevant Orders   Ambulatory referral to Gynecology   Angular cheilitis    rec continue MVI. Trial mupirocin          Follow up plan: No Follow-up on file.

## 2015-04-20 ENCOUNTER — Encounter: Payer: Self-pay | Admitting: Family Medicine

## 2015-04-20 DIAGNOSIS — N946 Dysmenorrhea, unspecified: Secondary | ICD-10-CM | POA: Insufficient documentation

## 2015-04-20 DIAGNOSIS — R634 Abnormal weight loss: Secondary | ICD-10-CM | POA: Insufficient documentation

## 2015-04-20 DIAGNOSIS — E559 Vitamin D deficiency, unspecified: Secondary | ICD-10-CM | POA: Insufficient documentation

## 2015-04-20 DIAGNOSIS — D171 Benign lipomatous neoplasm of skin and subcutaneous tissue of trunk: Secondary | ICD-10-CM | POA: Insufficient documentation

## 2015-04-20 NOTE — Assessment & Plan Note (Signed)
Reassured. Continue to monitor.  

## 2015-04-20 NOTE — Assessment & Plan Note (Signed)
Stable on PRN claritin.

## 2015-04-20 NOTE — Assessment & Plan Note (Signed)
Discussed treatment options - continue funginail.

## 2015-04-20 NOTE — Assessment & Plan Note (Signed)
Continue 1000 IU daily. 

## 2015-04-20 NOTE — Assessment & Plan Note (Signed)
Preventative protocols reviewed and updated unless pt declined. Discussed healthy diet and lifestyle.  Did not attempt GYN exam. Unsuccessful pap smear last year.

## 2015-04-20 NOTE — Assessment & Plan Note (Signed)
Stable period. Lives at [facility].

## 2015-04-20 NOTE — Assessment & Plan Note (Signed)
Check labs. Continue levothyroxine.

## 2015-04-20 NOTE — Assessment & Plan Note (Addendum)
Currently controlled with scheduled ibuprofen.  Mom requests GYN referral - would like to discuss possible ablation procedure.

## 2015-04-20 NOTE — Assessment & Plan Note (Signed)
rec continue MVI. Trial mupirocin

## 2015-04-20 NOTE — Assessment & Plan Note (Addendum)
Weight loss noted. abd pain endorsed. ?hunger. Very active, just ran 5K. Mom plans to have healthy diet options available.

## 2015-04-22 ENCOUNTER — Telehealth: Payer: Self-pay | Admitting: Obstetrics & Gynecology

## 2015-04-22 NOTE — Telephone Encounter (Signed)
Received a referral, patient has special needs, a request was to call mother for scheduling, called mother, no answer, left messaging instructing her to return my call here at the office

## 2015-04-25 ENCOUNTER — Other Ambulatory Visit: Payer: BC Managed Care – PPO

## 2015-04-26 ENCOUNTER — Other Ambulatory Visit: Payer: BC Managed Care – PPO

## 2015-05-02 ENCOUNTER — Encounter: Payer: BC Managed Care – PPO | Admitting: Obstetrics and Gynecology

## 2015-05-03 ENCOUNTER — Telehealth: Payer: Self-pay | Admitting: Radiology

## 2015-05-03 ENCOUNTER — Other Ambulatory Visit: Payer: BC Managed Care – PPO

## 2015-05-03 NOTE — Telephone Encounter (Signed)
Pts care takers asked, " if pt could have sedation prior to having lab draw". They spent about two hours here today trying to encourage her to have her blood draw, with no success.

## 2015-05-04 MED ORDER — DIAZEPAM 2 MG PO TABS: 2.0000 mg | ORAL_TABLET | Freq: Every day | ORAL | Status: DC | PRN

## 2015-05-04 NOTE — Telephone Encounter (Signed)
Ok to try valium prior to blood draw - plz phone in 2mg  tabs with instructions to take 1-2 tab prior to blood draw.

## 2015-05-05 NOTE — Telephone Encounter (Signed)
Rx called in as directed and message left advising caretaker of same.

## 2015-05-10 ENCOUNTER — Other Ambulatory Visit: Payer: BC Managed Care – PPO

## 2015-05-10 ENCOUNTER — Telehealth: Payer: Self-pay | Admitting: Radiology

## 2015-05-10 ENCOUNTER — Telehealth: Payer: Self-pay | Admitting: Obstetrics & Gynecology

## 2015-05-10 NOTE — Telephone Encounter (Signed)
We were unable to draw blood on Teresa Zhang. She is very combative, even after sedation (took two valium). Care takers wanted to know what to do next.

## 2015-05-10 NOTE — Telephone Encounter (Signed)
Called patient's mother Vickii Chafe to follow up on the referral that was sent to [place], no answer, left message instructing her to return my call here at the office

## 2015-05-26 MED ORDER — LIDOCAINE-PRILOCAINE 2.5-2.5 % EX CREA: 1.0000 "application " | TOPICAL_CREAM | CUTANEOUS | Status: AC | PRN

## 2015-05-26 NOTE — Telephone Encounter (Signed)
Call caregivers - how did valium work (I sent in 2mg  - did they try 2 or 4 mg?) We could try higher dose benzo. Would also offer EMLA numbing cream to skin so it won't hurt as much. Sent emla in - let me know about valium.

## 2015-05-30 NOTE — Telephone Encounter (Signed)
She took 4mg . I tried to assist with her venipuncture and she was very combative even after that. I honestly don't believe the EMLA would benefit her due to her combativeness unless she has something much stronger for sedation. The 4mg  of valium just isn't enough.

## 2015-05-30 NOTE — Telephone Encounter (Signed)
Would offer valium 5-10 mg + EMLA cream sent to pharmacy. May phone in valium 5mg  1-1.5 tab prn #4 RF0 if caregivers agree.

## 2015-06-03 MED ORDER — DIAZEPAM 5 MG PO TABS: ORAL_TABLET | ORAL | Status: DC

## 2015-06-03 NOTE — Telephone Encounter (Signed)
Spoke with Loa Socks and they are willing to try higher dose valium. Called into [facility] and lab appt scheduled.

## 2015-06-08 ENCOUNTER — Other Ambulatory Visit (INDEPENDENT_AMBULATORY_CARE_PROVIDER_SITE_OTHER): Payer: Medicaid Other

## 2015-06-08 DIAGNOSIS — E039 Hypothyroidism, unspecified: Secondary | ICD-10-CM | POA: Diagnosis not present

## 2015-06-08 DIAGNOSIS — R634 Abnormal weight loss: Secondary | ICD-10-CM

## 2015-06-08 DIAGNOSIS — E559 Vitamin D deficiency, unspecified: Secondary | ICD-10-CM

## 2015-06-08 LAB — COMPREHENSIVE METABOLIC PANEL
ALBUMIN: 4.6 g/dL (ref 3.5–5.2)
ALK PHOS: 86 U/L (ref 39–117)
ALT: 22 U/L (ref 0–35)
AST: 27 U/L (ref 0–37)
BUN: 11 mg/dL (ref 6–23)
CALCIUM: 10.1 mg/dL (ref 8.4–10.5)
CHLORIDE: 104 meq/L (ref 96–112)
CO2: 26 mEq/L (ref 19–32)
Creatinine, Ser: 0.69 mg/dL (ref 0.40–1.20)
GFR: 109.16 mL/min (ref >60.00)
Glucose, Bld: 118 mg/dL — ABNORMAL HIGH (ref 70–99)
POTASSIUM: 4.6 meq/L (ref 3.5–5.1)
Sodium: 139 mEq/L (ref 135–145)
Total Bilirubin: 0.5 mg/dL (ref 0.2–1.2)
Total Protein: 7.8 g/dL (ref 6.0–8.3)

## 2015-06-08 LAB — CBC WITH DIFFERENTIAL/PLATELET
BASOS ABS: 0 10*3/uL (ref 0.0–0.1)
Basophils Relative: 0.3 % (ref 0.0–3.0)
EOS PCT: 0.7 % (ref 0.0–5.0)
Eosinophils Absolute: 0 10*3/uL (ref 0.0–0.7)
HCT: 42.8 % (ref 36.0–46.0)
HEMOGLOBIN: 14.4 g/dL (ref 12.0–15.0)
Lymphocytes Relative: 24.3 % (ref 12.0–46.0)
Lymphs Abs: 1.4 10*3/uL (ref 0.7–4.0)
MCHC: 33.6 g/dL (ref 30.0–36.0)
MCV: 91.8 fl (ref 78.0–100.0)
MONOS PCT: 8.7 % (ref 3.0–12.0)
Monocytes Absolute: 0.5 10*3/uL (ref 0.1–1.0)
NEUTROS PCT: 66 % (ref 43.0–77.0)
Neutro Abs: 3.8 10*3/uL (ref 1.4–7.7)
PLATELETS: 301 10*3/uL (ref 150.0–400.0)
RBC: 4.67 Mil/uL (ref 3.87–5.11)
RDW: 13.9 % (ref 11.5–15.5)
WBC: 5.8 10*3/uL (ref 4.0–10.5)

## 2015-06-08 LAB — VITAMIN D 25 HYDROXY (VIT D DEFICIENCY, FRACTURES): VITD: 23.09 ng/mL — AB (ref 30.00–100.00)

## 2015-06-08 LAB — T4, FREE: Free T4: 1.52 ng/dL (ref 0.60–1.60)

## 2015-06-09 LAB — TSH: TSH: 0.04 u[IU]/mL — AB (ref 0.35–4.50)

## 2015-06-10 ENCOUNTER — Other Ambulatory Visit: Payer: Self-pay | Admitting: Family Medicine

## 2015-06-10 MED ORDER — LEVOTHYROXINE SODIUM 100 MCG PO TABS: 100.0000 ug | ORAL_TABLET | Freq: Every day | ORAL | Status: DC

## 2015-07-18 ENCOUNTER — Ambulatory Visit: Payer: Medicaid Other | Admitting: Family Medicine

## 2015-07-20 ENCOUNTER — Encounter: Payer: Self-pay | Admitting: Family Medicine

## 2015-07-20 ENCOUNTER — Ambulatory Visit (INDEPENDENT_AMBULATORY_CARE_PROVIDER_SITE_OTHER): Payer: BC Managed Care – PPO | Admitting: Family Medicine

## 2015-07-20 VITALS — BP 106/72 | HR 66 | Temp 97.9°F | Wt 105.0 lb

## 2015-07-20 DIAGNOSIS — R634 Abnormal weight loss: Secondary | ICD-10-CM

## 2015-07-20 DIAGNOSIS — R1033 Periumbilical pain: Secondary | ICD-10-CM | POA: Insufficient documentation

## 2015-07-20 DIAGNOSIS — Q909 Down syndrome, unspecified: Secondary | ICD-10-CM | POA: Diagnosis not present

## 2015-07-20 DIAGNOSIS — E039 Hypothyroidism, unspecified: Secondary | ICD-10-CM

## 2015-07-20 LAB — POC URINALSYSI DIPSTICK (AUTOMATED)
BILIRUBIN UA: NEGATIVE
Glucose, UA: NEGATIVE
KETONES UA: NEGATIVE
LEUKOCYTES UA: NEGATIVE
Nitrite, UA: NEGATIVE
PH UA: 6
PROTEIN UA: NEGATIVE
RBC UA: NEGATIVE
SPEC GRAV UA: 1.015
Urobilinogen, UA: NEGATIVE

## 2015-07-20 MED ORDER — DIAZEPAM 5 MG PO TABS: ORAL_TABLET | ORAL | Status: AC

## 2015-07-20 NOTE — Assessment & Plan Note (Signed)
No hernia appreciated, reassuring benign exam. I have asked them to keep bowel movement diary to eval for diarrheal illness and update me with results. Check UA today.

## 2015-07-20 NOTE — Progress Notes (Signed)
Pre visit review using our clinic review tool, if applicable. No additional management support is needed unless otherwise documented below in the visit note. 

## 2015-07-20 NOTE — Addendum Note (Signed)
Addended by: Lurlean Nanny on: 07/20/2015 05:02 PM   Modules accepted: Orders

## 2015-07-20 NOTE — Patient Instructions (Addendum)
No umbilical hernia today.  Let's keep an eye on frequency of bowel movements and if too frequent (more than 2-3 times every day) let me know. Goal is 1 soft stool a day.  Weight down 5 lbs since January. Continue encouraging 3 meals a day with snacks in between.  Monitor weights and let me know if continued weight loss noted.  Return at your convenience for thyroid check again (lab visit only). Let us know if we need refill of valium.  Urine check today.

## 2015-07-20 NOTE — Assessment & Plan Note (Signed)
Lower levothyroxine dose to 128mcg daily since 05/2015. I have asked Keertana to return in next 1 month to recheck TFTs and titrate med accordingly.

## 2015-07-20 NOTE — Assessment & Plan Note (Signed)
Continued weight loss noted despite liberalizing diet. Check TFTs when returns for labs (premedicate with valium). CBC, CMP recently WNL.

## 2015-07-20 NOTE — Progress Notes (Signed)
BP 106/72 mmHg  Pulse 66  Temp(Src) 97.9 F (36.6 C) (Oral)  Wt 105 lb (47.628 kg)  SpO2 98%   CC: abd discomfort with BM  Subjective:    Patient ID: Teresa Zhang, female    DOB: 09-28-1990, 25 y.o.   MRN: [id number]  HPI: Teresa Zhang is a 25 y.o. female presenting on 07/20/2015 for Abdominal Pain   H/o Down's Syndrome. Presents with caregiver Loma Sousa.   History limited by developmental delay. Notices pain at belly button only with bowel movements over last few months. Started drinking prune juice yesterday. She has had h/o rash at umbilicus as well, treated with lotion and now resolved. No fevers/chills, no current pain, no itching, no nausea/vomiting. Volunteer nurse recommended Carlota come in today to ensure no umbilical hernia.   Latest TSH 0.04 - we decreased levothyroxine to 13mcg daily (05/2015). Pt difficult stick - needed valium 5mg  last time.   Weight loss trend continued despite 3 regular meals and snacks in between and change to whole milk.   Relevant past medical, surgical, family and social history reviewed and updated as indicated. Interim medical history since our last visit reviewed. Allergies and medications reviewed and updated. Current Outpatient Prescriptions on File Prior to Visit  Medication Sig  . ascorbic acid (VITAMIN C) 500 MG/5ML syrup Take 100 mg by mouth daily. liposomal  . cetirizine (ZYRTEC) 10 MG tablet Take 10 mg by mouth daily as needed for allergies.   . Cholecalciferol (VITAMIN D3) 1000 UNITS CAPS Take 1 capsule (1,000 Units total) by mouth daily.  Marland Kitchen ibuprofen (ADVIL,MOTRIN) 200 MG tablet Take 200 mg by mouth as needed.  Marland Kitchen levothyroxine (SYNTHROID, LEVOTHROID) 100 MCG tablet Take 1 tablet (100 mcg total) by mouth daily before breakfast.  . lidocaine-prilocaine (EMLA) cream Apply 1 application topically as needed.  . Undecylenic Acid (FUNGI-NAIL) 25 % SOLN Apply to affected area daily on toes  . Docusate Sodium 150 MG/15ML syrup Apply 1 cc into  affected ear. If no clearing, flush with saline (Patient not taking: Reported on 07/20/2015)  . mupirocin ointment (BACTROBAN) 2 % Apply 1 application topically 2 (two) times daily. To bumps on private area (Patient not taking: Reported on 07/20/2015)   No current facility-administered medications on file prior to visit.    Review of Systems Per HPI unless specifically indicated in ROS section     Objective:    BP 106/72 mmHg  Pulse 66  Temp(Src) 97.9 F (36.6 C) (Oral)  Wt 105 lb (47.628 kg)  SpO2 98%  Wt Readings from Last 3 Encounters:  07/20/15 105 lb (47.628 kg)  04/19/15 110 lb 8 oz (50.122 kg)  10/26/14 113 lb (51.256 kg)   Body mass index is 22.31 kg/(m^2).  Physical Exam  Constitutional: She appears well-developed and well-nourished. No distress.  HENT:  Mouth/Throat: Oropharynx is clear and moist. No oropharyngeal exudate.  Eyes: Conjunctivae and EOM are normal. Pupils are equal, round, and reactive to light. No scleral icterus.  Cardiovascular: Normal rate, regular rhythm, normal heart sounds and intact distal pulses.   No murmur heard. Pulmonary/Chest: Effort normal and breath sounds normal. No respiratory distress. She has no wheezes. She has no rales.  Abdominal: Soft. Normal appearance and bowel sounds are normal. She exhibits no distension and no mass. There is no hepatosplenomegaly. There is no tenderness. There is no rigidity, no rebound, no guarding, no CVA tenderness and negative Murphy's sign. No hernia. Hernia confirmed negative in the ventral area, confirmed negative in the  right inguinal area and confirmed negative in the left inguinal area.  No umbilical hernia appreciated today  Musculoskeletal: She exhibits no edema.  Skin: Skin is warm and dry. No rash noted.  Psychiatric: She has a normal mood and affect.  Nursing note and vitals reviewed.  Results for orders placed or performed in visit on 06/08/15  Comprehensive metabolic panel  Result Value Ref  Range   Sodium 139 135 - 145 mEq/L   Potassium 4.6 3.5 - 5.1 mEq/L   Chloride 104 96 - 112 mEq/L   CO2 26 19 - 32 mEq/L   Glucose, Bld 118 (H) 70 - 99 mg/dL   BUN 11 6 - 23 mg/dL   Creatinine, Ser 0.69 0.40 - 1.20 mg/dL   Total Bilirubin 0.5 0.2 - 1.2 mg/dL   Alkaline Phosphatase 86 39 - 117 U/L   AST 27 0 - 37 U/L   ALT 22 0 - 35 U/L   Total Protein 7.8 6.0 - 8.3 g/dL   Albumin 4.6 3.5 - 5.2 g/dL   Calcium 10.1 8.4 - 10.5 mg/dL   GFR 109.16 >60.00 mL/min  TSH  Result Value Ref Range   TSH 0.04 (L) 0.35 - 4.50 uIU/mL  CBC with Differential/Platelet  Result Value Ref Range   WBC 5.8 4.0 - 10.5 K/uL   RBC 4.67 3.87 - 5.11 Mil/uL   Hemoglobin 14.4 12.0 - 15.0 g/dL   HCT 42.8 36.0 - 46.0 %   MCV 91.8 78.0 - 100.0 fl   MCHC 33.6 30.0 - 36.0 g/dL   RDW 13.9 11.5 - 15.5 %   Platelets 301.0 150.0 - 400.0 K/uL   Neutrophils Relative % 66.0 43.0 - 77.0 %   Lymphocytes Relative 24.3 12.0 - 46.0 %   Monocytes Relative 8.7 3.0 - 12.0 %   Eosinophils Relative 0.7 0.0 - 5.0 %   Basophils Relative 0.3 0.0 - 3.0 %   Neutro Abs 3.8 1.4 - 7.7 K/uL   Lymphs Abs 1.4 0.7 - 4.0 K/uL   Monocytes Absolute 0.5 0.1 - 1.0 K/uL   Eosinophils Absolute 0.0 0.0 - 0.7 K/uL   Basophils Absolute 0.0 0.0 - 0.1 K/uL  T4, free  Result Value Ref Range   Free T4 1.52 0.60 - 1.60 ng/dL  VITAMIN D 25 Hydroxy (Vit-D Deficiency, Fractures)  Result Value Ref Range   VITD 23.09 (L) 30.00 - 100.00 ng/mL      Assessment & Plan:   Problem List Items Addressed This Visit    Down's syndrome   Hypothyroidism    Lower levothyroxine dose to 151mcg daily since 05/2015. I have asked Mariadelrosari to return in next 1 month to recheck TFTs and titrate med accordingly.      Loss of weight    Continued weight loss noted despite liberalizing diet. Check TFTs when returns for labs (premedicate with valium). CBC, CMP recently WNL.      Umbilical pain - Primary    No hernia appreciated, reassuring benign exam. I have asked them  to keep bowel movement diary to eval for diarrheal illness and update me with results. Check UA today.           Follow up plan: Return as needed, for follow up visit.  Ria Bush, MD

## 2015-07-23 ENCOUNTER — Other Ambulatory Visit: Payer: Self-pay | Admitting: Family Medicine

## 2015-07-23 DIAGNOSIS — E039 Hypothyroidism, unspecified: Secondary | ICD-10-CM

## 2015-07-25 ENCOUNTER — Telehealth: Payer: Self-pay | Admitting: Family Medicine

## 2015-07-25 NOTE — Telephone Encounter (Signed)
Called patient's mom back with lab results.

## 2015-07-25 NOTE — Telephone Encounter (Signed)
Please call patient's mother,Teresa Zhang,back with lab results.

## 2015-08-22 ENCOUNTER — Other Ambulatory Visit: Payer: Medicaid Other

## 2015-08-24 ENCOUNTER — Other Ambulatory Visit (INDEPENDENT_AMBULATORY_CARE_PROVIDER_SITE_OTHER): Payer: Medicaid Other

## 2015-08-24 ENCOUNTER — Other Ambulatory Visit: Payer: Medicaid Other

## 2015-08-24 DIAGNOSIS — E039 Hypothyroidism, unspecified: Secondary | ICD-10-CM | POA: Diagnosis not present

## 2015-08-24 LAB — T4, FREE: FREE T4: 1.54 ng/dL (ref 0.60–1.60)

## 2015-08-24 LAB — TSH: TSH: 0.33 u[IU]/mL — ABNORMAL LOW (ref 0.35–4.50)

## 2015-08-24 LAB — T3: T3, Total: 106 ng/dL (ref 76–181)

## 2015-08-27 ENCOUNTER — Other Ambulatory Visit: Payer: Self-pay | Admitting: Family Medicine

## 2015-08-27 MED ORDER — LEVOTHYROXINE SODIUM 88 MCG PO TABS: 88.0000 ug | ORAL_TABLET | Freq: Every day | ORAL | Status: DC

## 2015-10-25 ENCOUNTER — Encounter: Payer: Self-pay | Admitting: Family Medicine

## 2015-10-25 ENCOUNTER — Ambulatory Visit (INDEPENDENT_AMBULATORY_CARE_PROVIDER_SITE_OTHER): Payer: Medicaid Other | Admitting: Family Medicine

## 2015-10-25 VITALS — BP 104/74 | HR 66 | Temp 98.3°F | Ht <= 58 in | Wt 104.5 lb

## 2015-10-25 DIAGNOSIS — E039 Hypothyroidism, unspecified: Secondary | ICD-10-CM

## 2015-10-25 DIAGNOSIS — R634 Abnormal weight loss: Secondary | ICD-10-CM | POA: Diagnosis not present

## 2015-10-25 DIAGNOSIS — Q909 Down syndrome, unspecified: Secondary | ICD-10-CM

## 2015-10-25 MED ORDER — LEVOTHYROXINE SODIUM 75 MCG PO TABS: 75.0000 ug | ORAL_TABLET | Freq: Every day | ORAL | Status: DC

## 2015-10-25 NOTE — Progress Notes (Signed)
BP 104/74 mmHg  Pulse 66  Temp(Src) 98.3 F (36.8 C) (Oral)  Ht 4\' 10"  (1.473 m)  Wt 104 lb 8 oz (47.401 kg)  BMI 21.85 kg/m2  SpO2 98%  LMP 10/11/2015   CC: f/u visit  Subjective:    Patient ID: Teresa Zhang, female    DOB: 11-19-1990, 25 y.o.   MRN: [id number]  HPI: Teresa Zhang is a 25 y.o. female presenting on 10/25/2015 for Follow-up   Presents with mom and caregiver - Willette Cluster. Amiera's family is about to move to [facility] (mom and dad) to be closer to grandchild.  See recent office and phone notes and labs for details. Briefly, we have been decreasing thyroid dosing due to low TSH and weight loss over last several readings. Latest dose levothyroxine 14mcg daily.   Reviewed healthy diet and protein intake. Does not like peanut butter. 3 meals a day, some vegetarian but also some lean meats. Healthy snacks in between. Whole milk.   Moving bowels regularly. Drinking water and prune juice.   Pt difficult stick - needed valium 5-10mg  last time.   Relevant past medical, surgical, family and social history reviewed and updated as indicated. Interim medical history since our last visit reviewed. Allergies and medications reviewed and updated. Current Outpatient Prescriptions on File Prior to Visit  Medication Sig  . ascorbic acid (VITAMIN C) 500 MG/5ML syrup Take 100 mg by mouth daily. liposomal  . cetirizine (ZYRTEC) 10 MG tablet Take 10 mg by mouth daily as needed for allergies.   . Cholecalciferol (VITAMIN D3) 1000 UNITS CAPS Take 1 capsule (1,000 Units total) by mouth daily.  . diazepam (VALIUM) 5 MG tablet One to one and half tablets as needed.  Mariane Baumgarten Sodium 150 MG/15ML syrup Apply 1 cc into affected ear. If no clearing, flush with saline (Patient taking differently: as needed. Apply 1 cc into affected ear. If no clearing, flush with saline)  . ibuprofen (ADVIL,MOTRIN) 200 MG tablet Take 200 mg by mouth as needed.  . lidocaine-prilocaine (EMLA) cream Apply 1  application topically as needed.  . Undecylenic Acid (FUNGI-NAIL) 25 % SOLN Apply to affected area daily on toes   No current facility-administered medications on file prior to visit.    Review of Systems Per HPI unless specifically indicated in ROS section     Objective:    BP 104/74 mmHg  Pulse 66  Temp(Src) 98.3 F (36.8 C) (Oral)  Ht 4\' 10"  (1.473 m)  Wt 104 lb 8 oz (47.401 kg)  BMI 21.85 kg/m2  SpO2 98%  LMP 10/11/2015  Wt Readings from Last 3 Encounters:  10/25/15 104 lb 8 oz (47.401 kg)  07/20/15 105 lb (47.628 kg)  04/19/15 110 lb 8 oz (50.122 kg)    Physical Exam  Constitutional: She appears well-developed and well-nourished. No distress.  HENT:  Mouth/Throat: Oropharynx is clear and moist. No oropharyngeal exudate.  Cardiovascular: Normal rate, regular rhythm, normal heart sounds and intact distal pulses.   No murmur heard. Pulmonary/Chest: Effort normal and breath sounds normal. No respiratory distress. She has no wheezes. She has no rales.  Musculoskeletal: She exhibits no edema.  Skin: Skin is warm and dry. No rash noted.  Psychiatric: She has a normal mood and affect.  Nursing note and vitals reviewed.  Results for orders placed or performed in visit on 08/24/15  TSH  Result Value Ref Range   TSH 0.33 (L) 0.35 - 4.50 uIU/mL  T3  Result Value Ref Range   T3,  Total 106.0 76 - 181 ng/dL  T4, free  Result Value Ref Range   Free T4 1.54 0.60 - 1.60 ng/dL      Assessment & Plan:   Problem List Items Addressed This Visit    Down's syndrome   Hypothyroidism - Primary    Weight loss has stabilized since decreased dose to 60mcg daily. Will further decrease to 49mcg, recheck levels at 3 mo f/u visit. Mom and caregiver agree.       Relevant Medications   levothyroxine (SYNTHROID, LEVOTHROID) 75 MCG tablet   Loss of weight    See above. 1/2 lb weight loss in last 3 months - stabilized.  RTC 3 mo f/u visit recheck weight.  rec ensure/boost MWF before  lunch, may add spoonful of protein powder to daily morning smoothies.           Follow up plan: Return in about 3 months (around 01/25/2016), or as needed, for follow up visit.  Ria Bush, MD

## 2015-10-25 NOTE — Assessment & Plan Note (Addendum)
See above. 1/2 lb weight loss in last 3 months - stabilized.  RTC 3 mo f/u visit recheck weight.  rec ensure/boost MWF before lunch, may add spoonful of protein powder to daily morning smoothies.

## 2015-10-25 NOTE — Progress Notes (Signed)
Pre visit review using our clinic review tool, if applicable. No additional management support is needed unless otherwise documented below in the visit note. 

## 2015-10-25 NOTE — Patient Instructions (Addendum)
Decrease levothyroxine to 104mcg daily.  Incorporate ensure or boost three times a week - or extra scoop of protein in smoothie.  Return in 3 months for follow up weight and recheck thyroid levels.

## 2015-10-25 NOTE — Assessment & Plan Note (Signed)
Weight loss has stabilized since decreased dose to 72mcg daily. Will further decrease to 22mcg, recheck levels at 3 mo f/u visit. Mom and caregiver agree.

## 2016-01-26 ENCOUNTER — Encounter: Payer: Self-pay | Admitting: Family Medicine

## 2016-01-26 ENCOUNTER — Ambulatory Visit (INDEPENDENT_AMBULATORY_CARE_PROVIDER_SITE_OTHER): Payer: Medicaid Other | Admitting: Family Medicine

## 2016-01-26 VITALS — BP 116/70 | HR 68 | Temp 97.9°F | Wt 112.2 lb

## 2016-01-26 DIAGNOSIS — N946 Dysmenorrhea, unspecified: Secondary | ICD-10-CM

## 2016-01-26 DIAGNOSIS — R634 Abnormal weight loss: Secondary | ICD-10-CM

## 2016-01-26 DIAGNOSIS — R21 Rash and other nonspecific skin eruption: Secondary | ICD-10-CM

## 2016-01-26 DIAGNOSIS — Q909 Down syndrome, unspecified: Secondary | ICD-10-CM | POA: Diagnosis not present

## 2016-01-26 DIAGNOSIS — E039 Hypothyroidism, unspecified: Secondary | ICD-10-CM | POA: Diagnosis not present

## 2016-01-26 DIAGNOSIS — R1084 Generalized abdominal pain: Secondary | ICD-10-CM

## 2016-01-26 NOTE — Patient Instructions (Addendum)
[facility] to schedule [facility] appointment [phone]. Referral from earlier this year should still be valid.  Return for labs for thyroid check later this week, take diazepam 5mg  in advance.  For rash - possible chafing - looking ok today. Continue cream currently used, consider desitin barrier cream. For abdominal discomfort - we will order abdominal ultrasound. Start phillips colon health probiotic daily for 2 weeks.  Return in 3-4 months for follow up, sooner if needed.

## 2016-01-26 NOTE — Progress Notes (Signed)
Pre visit review using our clinic review tool, if applicable. No additional management support is needed unless otherwise documented below in the visit note. 

## 2016-01-26 NOTE — Progress Notes (Signed)
BP 116/70   Pulse 68   Temp 97.9 F (36.6 C) (Oral)   Wt 112 lb 4 oz (50.9 kg)   LMP 01/19/2016   BMI 23.46 kg/m    CC: 3 mo f/u visit Subjective:    Patient ID: Teresa Zhang, female    DOB: [birth year], 25 y.o.   MRN: [id number]  HPI: Emily Crooker is a 25 y.o. female presenting on 01/26/2016 for Follow-up   Presents with caregiver Vernie Shanks. Kyannah's family has moved to [facility] (mom and dad) to be closer to grandchild.   See recent office and phone notes and labs for details. Briefly, we have been decreasing thyroid dosing due to low TSH and weight loss over last several readings. Latest dose change was decrease to levothyroxine 32mcg daily. 8lb weight gain in last 3 months!  Noticing looser stools than normal and increased bloating and gassiness. Avoiding dairy but has not really noted relationship with milk products. She is drinking keefer. She has not been taking colace. She tried imodium which helped. No blood in stool, no vomiting.    Rash at R groin over last week. Using talc powder and abx cream. Occasionally painful.   Pt difficult stick - needed valium 5-10mg  last time.   Declines flu shot today.   LMP - 01/19/2016. Prior to this 01/06/2016. More irregular and frequent periods recently. Increased cramping with mood changes with periods. She has been spotting all this month, with some heavy bleeding with periods. Prior attempt at well woman exam/pap smear unsuccessful due to increased anxiety.   Lives at [facility]. Caregiver Chrys Racer. Mother Vickii Chafe. Down's Syndrome Activity: goes to gym (yoga, swimming, running) She runs. She also swims for special olympics. Diet: good water, fruits/vegetables daily  Relevant past medical, surgical, family and social history reviewed and updated as indicated. Interim medical history since our last visit reviewed. Allergies and medications reviewed and updated. Current Outpatient Prescriptions on File Prior to Visit  Medication Sig    . ascorbic acid (VITAMIN C) 500 MG/5ML syrup Take 100 mg by mouth daily. liposomal  . cetirizine (ZYRTEC) 10 MG tablet Take 10 mg by mouth daily as needed for allergies.   Mariane Baumgarten Sodium 150 MG/15ML syrup Apply 1 cc into affected ear. If no clearing, flush with saline (Patient taking differently: as needed. Apply 1 cc into affected ear. If no clearing, flush with saline)  . ibuprofen (ADVIL,MOTRIN) 200 MG tablet Take 200 mg by mouth as needed.  Marland Kitchen levothyroxine (SYNTHROID, LEVOTHROID) 75 MCG tablet Take 1 tablet (75 mcg total) by mouth daily before breakfast.  . Cholecalciferol (VITAMIN D3) 1000 UNITS CAPS Take 1 capsule (1,000 Units total) by mouth daily.  . diazepam (VALIUM) 5 MG tablet One to one and half tablets as needed. (Patient not taking: Reported on 01/26/2016)  . lidocaine-prilocaine (EMLA) cream Apply 1 application topically as needed. (Patient not taking: Reported on 01/26/2016)  . Undecylenic Acid (FUNGI-NAIL) 25 % SOLN Apply to affected area daily on toes (Patient not taking: Reported on 01/26/2016)   No current facility-administered medications on file prior to visit.     Review of Systems Per HPI unless specifically indicated in ROS section     Objective:    BP 116/70   Pulse 68   Temp 97.9 F (36.6 C) (Oral)   Wt 112 lb 4 oz (50.9 kg)   LMP 01/19/2016   BMI 23.46 kg/m   Wt Readings from Last 3 Encounters:  01/26/16 112 lb 4 oz (50.9 kg)  10/25/15 104 lb 8 oz (47.4 kg)  07/20/15 105 lb (47.6 kg)    Physical Exam  Constitutional: She appears well-developed and well-nourished. No distress.  HENT:  Mouth/Throat: Oropharynx is clear and moist. No oropharyngeal exudate.  Eyes: Conjunctivae are normal. Pupils are equal, round, and reactive to light.  Neck: No thyromegaly present.  Cardiovascular: Normal rate, regular rhythm, normal heart sounds and intact distal pulses.   No murmur heard. Pulmonary/Chest: Effort normal and breath sounds normal. No respiratory  distress. She has no wheezes. She has no rales.  Abdominal: Soft. Normal appearance and bowel sounds are normal. She exhibits no distension and no mass. There is no hepatosplenomegaly. There is tenderness (mild) in the right upper quadrant. There is no rigidity, no rebound, no guarding, no CVA tenderness and negative Murphy's sign.  Musculoskeletal: She exhibits no edema.  Skin: Skin is warm and dry. Rash noted.  Perioral rash at angle of lips Mild erythema with dried blister R inner groin crease  Psychiatric: Her mood appears anxious (mildly).  Nursing note and vitals reviewed.  Results for orders placed or performed in visit on 08/24/15  TSH  Result Value Ref Range   TSH 0.33 (L) 0.35 - 4.50 uIU/mL  T3  Result Value Ref Range   T3, Total 106.0 76 - 181 ng/dL  T4, free  Result Value Ref Range   Free T4 1.54 0.60 - 1.60 ng/dL     Assessment & Plan:  Pt and caregiver decline flu shot today. Problem List Items Addressed This Visit    Abdominal pain    Difficult historian due to down's syndrome. Endorsing abdominal gassiness, bloating and loose stools along with discomfort. Will first want to ensure thyroid levels are in range. Check further labwork when she returns. Evaluate for celiac disease. Check abd [place] for further evaluation given RUQ discomfort to palpation. ?component of IBS with anxiety. Recommended trial probiotic for next 2 weeks. If ineffective, consider trial PPI given NSAID use for periods.      Relevant Orders   US Abdomen Complete   Comprehensive metabolic panel   CBC with Differential/Platelet   Lipase   Tissue transglutaminase, IgA   Down's syndrome   Dysmenorrhea    Mom had previously requested GYN referral to discuss ablation for dysmenorrhea (previously controlled with ibuprofen), this was never completed. Now with some menometrorrhagia - will place new referral to GYN for further eval.       Hypothyroidism - Primary    Update labs at their convenience- they  are not ready for blood work today - reviewed valium dosing for blood draws (5-10mg  PRN). They have 2 valium 5mg  pills left. 8 lb weight gain noted. Continue levothyroxine 76mcg at this time.       Relevant Orders   TSH   T4, free   Loss of weight    Weight loss has stabilized and now she has gained 8 lbs.       Relevant Orders   Tissue transglutaminase, IgA   Skin rash    Overall benign appearing rash - ?just chafing. rec continue desitin barrier cream or abx cream given by home RN.        Other Visit Diagnoses   None.      Follow up plan: Return in about 3 months (around 04/27/2016).  Ria Bush, MD

## 2016-01-26 NOTE — Assessment & Plan Note (Addendum)
Update labs at their convenience- they are not ready for blood work today - reviewed valium dosing for blood draws (5-10mg  PRN). They have 2 valium 5mg  pills left. 8 lb weight gain noted. Continue levothyroxine 69mcg at this time.

## 2016-01-28 DIAGNOSIS — R21 Rash and other nonspecific skin eruption: Secondary | ICD-10-CM | POA: Insufficient documentation

## 2016-01-28 DIAGNOSIS — R109 Unspecified abdominal pain: Secondary | ICD-10-CM | POA: Insufficient documentation

## 2016-01-28 NOTE — Assessment & Plan Note (Signed)
Overall benign appearing rash - ?just chafing. rec continue desitin barrier cream or abx cream given by home RN.

## 2016-01-28 NOTE — Assessment & Plan Note (Signed)
Mom had previously requested GYN referral to discuss ablation for dysmenorrhea (previously controlled with ibuprofen), this was never completed. Now with some menometrorrhagia - will place new referral to GYN for further eval.

## 2016-01-28 NOTE — Assessment & Plan Note (Addendum)
Difficult historian due to down's syndrome. Endorsing abdominal gassiness, bloating and loose stools along with discomfort. Will first want to ensure thyroid levels are in range. Check further labwork when she returns. Evaluate for celiac disease. Check abd [place] for further evaluation given RUQ discomfort to palpation. ?component of IBS with anxiety. Recommended trial probiotic for next 2 weeks. If ineffective, consider trial PPI given NSAID use for periods.

## 2016-01-28 NOTE — Assessment & Plan Note (Signed)
Weight loss has stabilized and now she has gained 8 lbs.

## 2016-01-31 ENCOUNTER — Other Ambulatory Visit: Payer: Medicaid Other

## 2016-02-01 ENCOUNTER — Other Ambulatory Visit: Payer: Self-pay | Admitting: Family Medicine

## 2016-02-01 ENCOUNTER — Other Ambulatory Visit (INDEPENDENT_AMBULATORY_CARE_PROVIDER_SITE_OTHER): Payer: Medicaid Other

## 2016-02-01 DIAGNOSIS — R634 Abnormal weight loss: Secondary | ICD-10-CM

## 2016-02-01 DIAGNOSIS — R1084 Generalized abdominal pain: Secondary | ICD-10-CM | POA: Diagnosis not present

## 2016-02-01 DIAGNOSIS — E039 Hypothyroidism, unspecified: Secondary | ICD-10-CM

## 2016-02-01 LAB — COMPREHENSIVE METABOLIC PANEL
ALBUMIN: 4.9 g/dL (ref 3.5–5.2)
ALK PHOS: 97 U/L (ref 39–117)
ALT: 21 U/L (ref 0–35)
AST: 28 U/L (ref 0–37)
BUN: 18 mg/dL (ref 6–23)
CO2: 26 mEq/L (ref 19–32)
CREATININE: 0.73 mg/dL (ref 0.40–1.20)
Calcium: 10 mg/dL (ref 8.4–10.5)
Chloride: 102 mEq/L (ref 96–112)
GFR: 101.77 mL/min (ref >60.00)
Glucose, Bld: 99 mg/dL (ref 70–99)
POTASSIUM: 5.1 meq/L (ref 3.5–5.1)
Sodium: 136 mEq/L (ref 135–145)
TOTAL PROTEIN: 8.9 g/dL — AB (ref 6.0–8.3)
Total Bilirubin: 0.4 mg/dL (ref 0.2–1.2)

## 2016-02-01 LAB — CBC WITH DIFFERENTIAL/PLATELET
BASOS ABS: 0.1 10*3/uL (ref 0.0–0.1)
Basophils Relative: 0.7 % (ref 0.0–3.0)
EOS ABS: 0.1 10*3/uL (ref 0.0–0.7)
Eosinophils Relative: 0.9 % (ref 0.0–5.0)
HCT: 43.8 % (ref 36.0–46.0)
Hemoglobin: 15.2 g/dL — ABNORMAL HIGH (ref 12.0–15.0)
LYMPHS ABS: 1.6 10*3/uL (ref 0.7–4.0)
LYMPHS PCT: 21 % (ref 12.0–46.0)
MCHC: 34.7 g/dL (ref 30.0–36.0)
MCV: 92.2 fl (ref 78.0–100.0)
MONO ABS: 0.5 10*3/uL (ref 0.1–1.0)
Monocytes Relative: 7 % (ref 3.0–12.0)
NEUTROS ABS: 5.2 10*3/uL (ref 1.4–7.7)
NEUTROS PCT: 70.4 % (ref 43.0–77.0)
PLATELETS: 307 10*3/uL (ref 150.0–400.0)
RBC: 4.76 Mil/uL (ref 3.87–5.11)
RDW: 13.6 % (ref 11.5–15.5)
WBC: 7.4 10*3/uL (ref 4.0–10.5)

## 2016-02-01 LAB — T4, FREE: Free T4: 0.84 ng/dL (ref 0.60–1.60)

## 2016-02-01 LAB — LIPASE: LIPASE: 27 U/L (ref 11.0–59.0)

## 2016-02-01 LAB — TSH: TSH: 5.7 u[IU]/mL — AB (ref 0.35–4.50)

## 2016-02-01 MED ORDER — LEVOTHYROXINE SODIUM 88 MCG PO TABS: 88.0000 ug | ORAL_TABLET | Freq: Every day | ORAL | 1 refills | Status: AC

## 2016-02-02 LAB — TISSUE TRANSGLUTAMINASE, IGA: TISSUE TRANSGLUTAMINASE AB, IGA: 1 U/mL (ref <4)

## 2016-02-03 ENCOUNTER — Ambulatory Visit
Admission: RE | Admit: 2016-02-03 | Discharge: 2016-02-03 | Disposition: A | Discharge status: Home / Self Care | Payer: Medicaid Other | Source: Ambulatory Visit | Attending: Family Medicine | Admitting: Family Medicine

## 2016-02-03 DIAGNOSIS — R1084 Generalized abdominal pain: Secondary | ICD-10-CM | POA: Diagnosis present

## 2016-04-23 ENCOUNTER — Other Ambulatory Visit: Payer: Self-pay | Admitting: Family Medicine

## 2018-03-14 IMAGING — US US ABDOMEN COMPLETE
1 series · 14 of 25 positions shown · non-contrast
Comparison: None.

CLINICAL DATA: 24-year-old female with generalized abdominal pain.

EXAM:
ABDOMEN ULTRASOUND COMPLETE

[Series 1: us abdomen complete · 0.14mm/px · 14 of 89 slices shown]
[im 1/89]
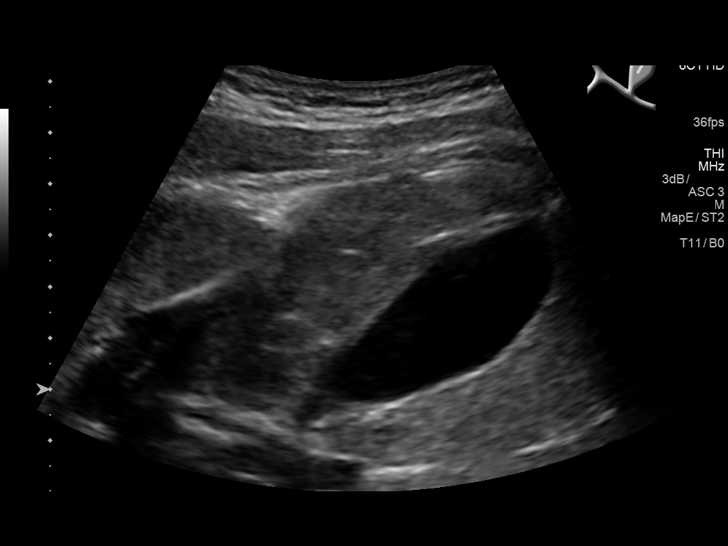
[im 8/89]
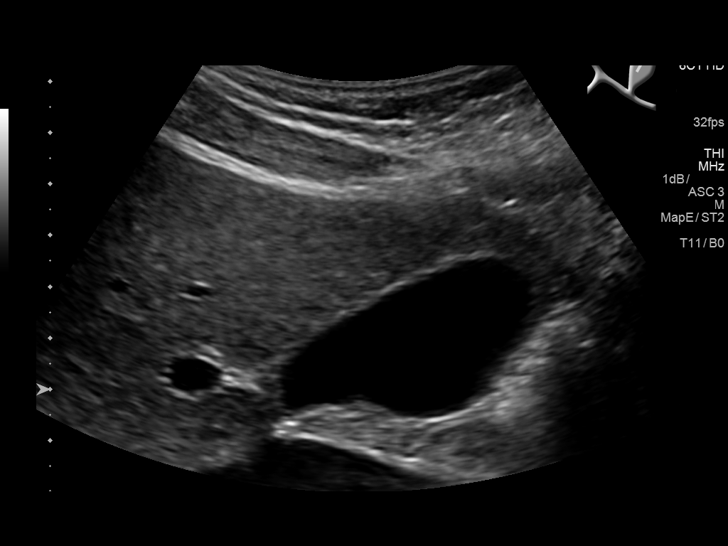
[im 15/89]
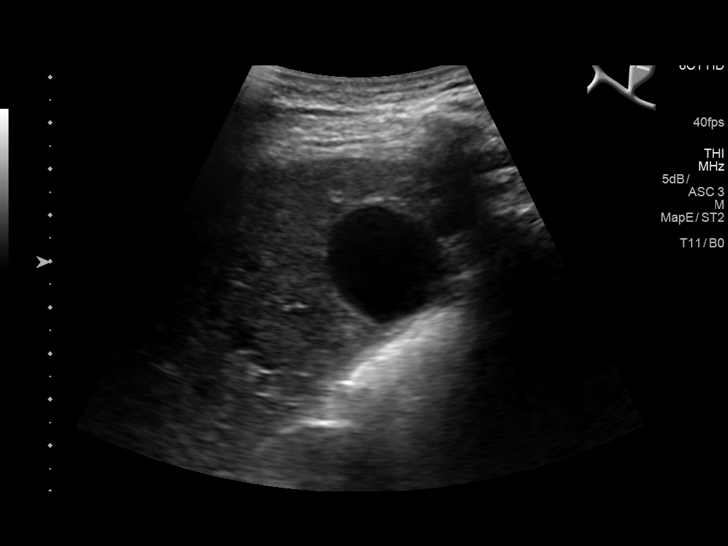
[im 23/89]
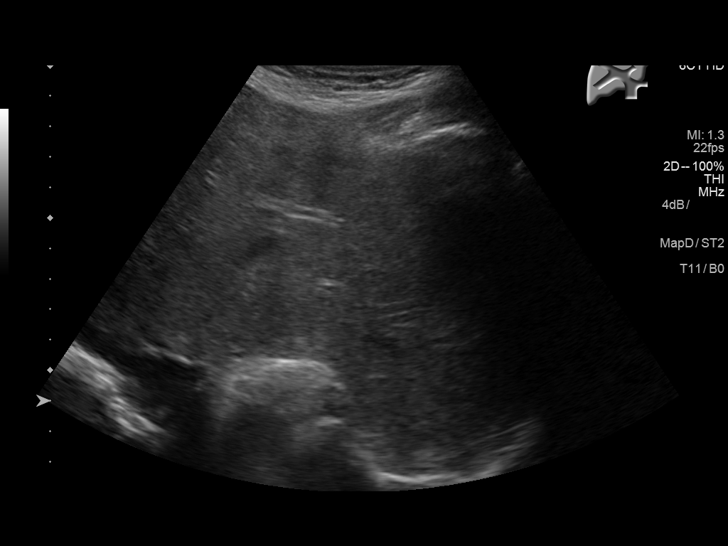
[im 30/89]
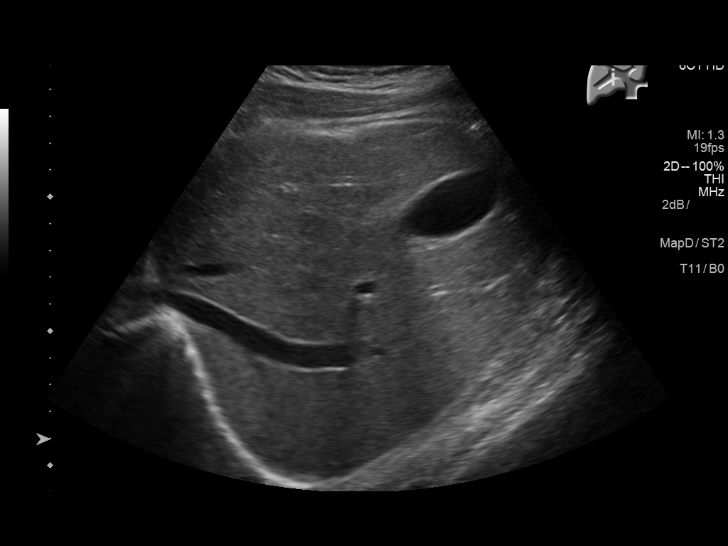
[im 34/89]
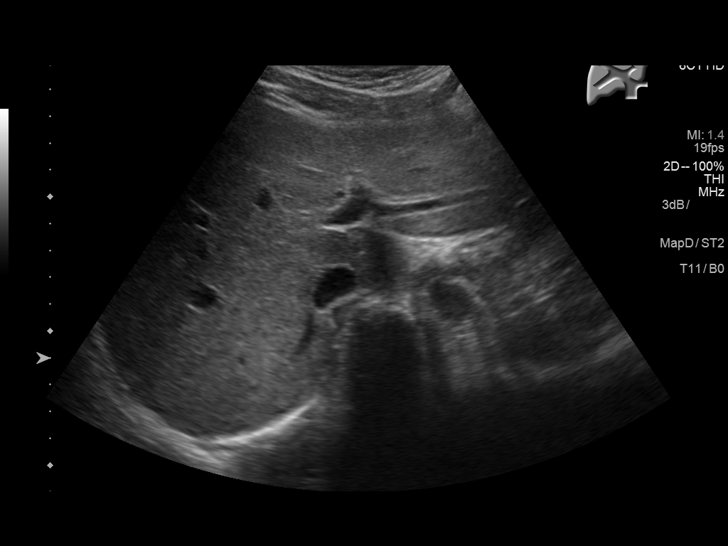
[im 41/89]
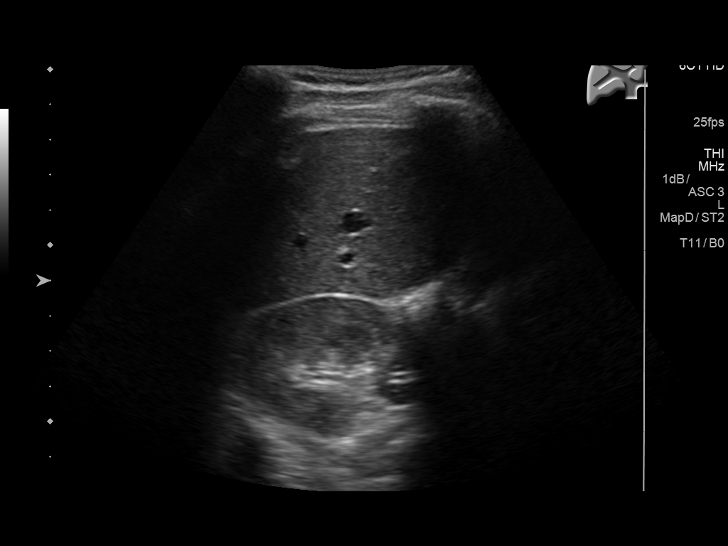
[im 48/89]
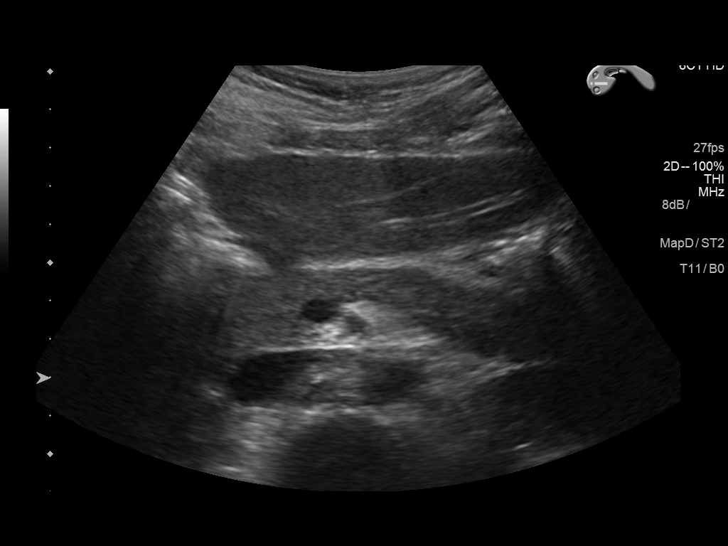
[im 56/89]
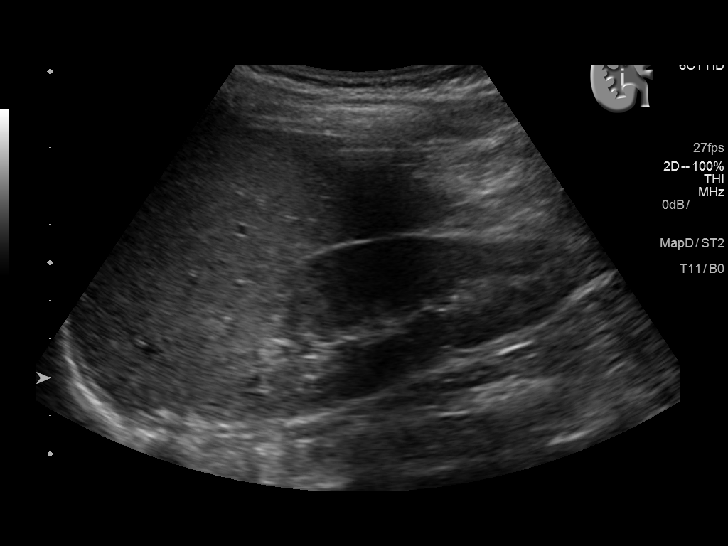
[im 59/89]
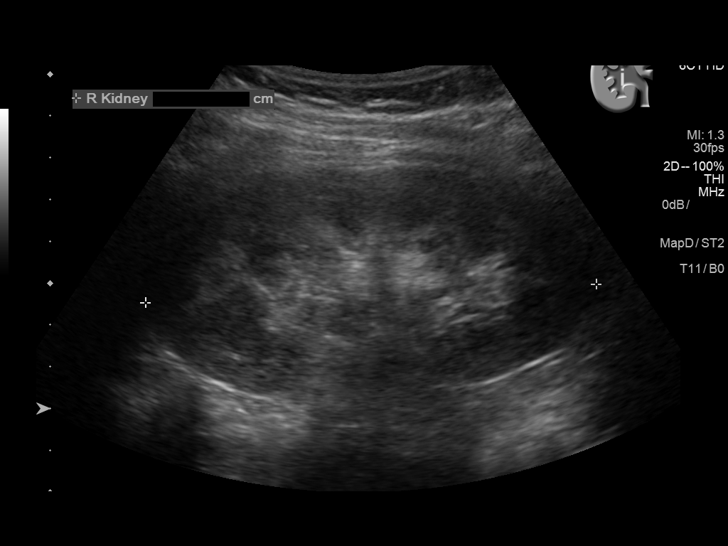
[im 67/89]
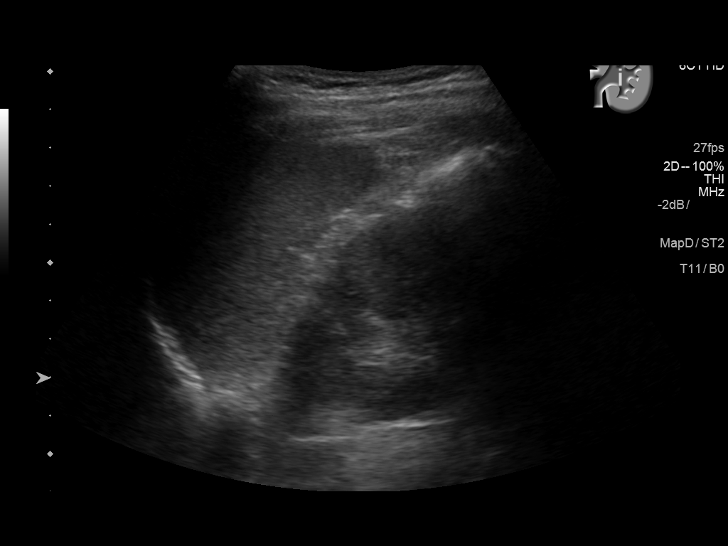
[im 74/89]
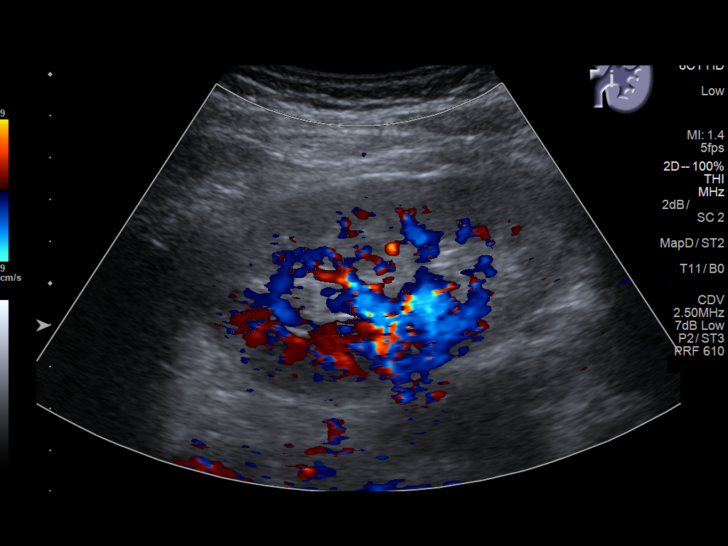
[im 81/89]
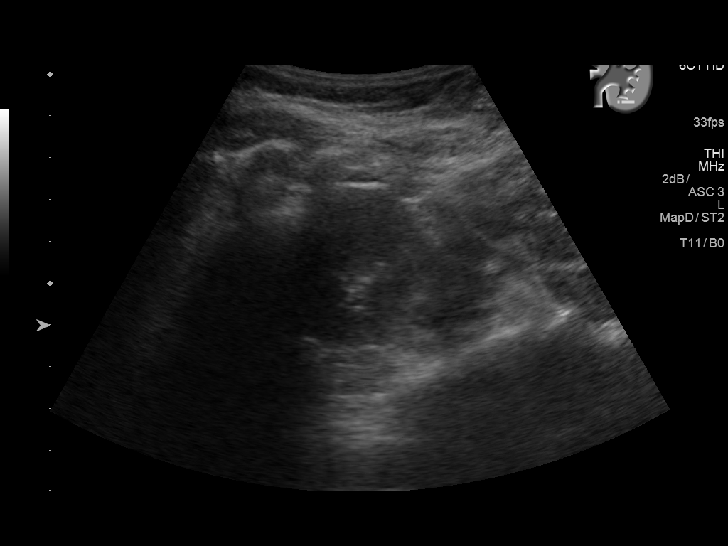
[im 89/89]
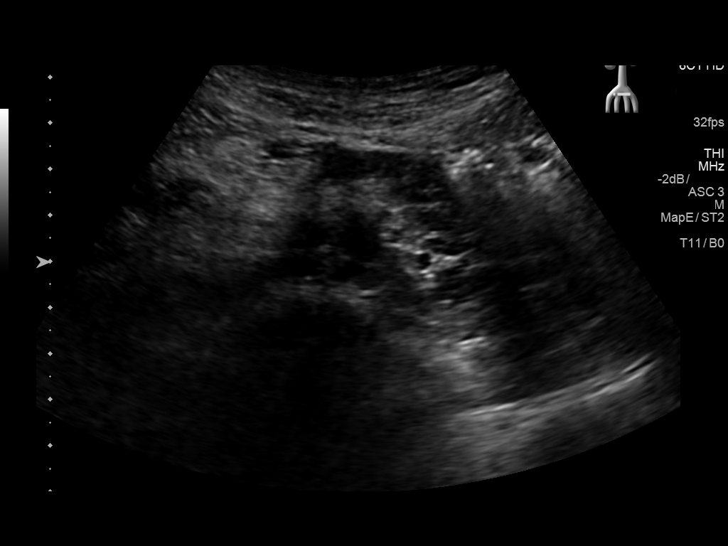

[14 of 25 positions shown; findings below may reference images not displayed]

FINDINGS: Gallbladder: The gallbladder is unremarkable. There is no evidence
of cholelithiasis or acute cholecystitis.

Common bile duct: Diameter: 2.5 mm. There is no evidence of
intrahepatic or extrahepatic biliary dilatation.

Liver: No focal lesion identified. Within normal limits in
parenchymal echogenicity.

IVC: No abnormality visualized.

Pancreas: Visualized portion unremarkable.

Spleen: Size and appearance within normal limits.

Right Kidney: Length: 10.8 cm. Echogenicity within normal limits. No
mass or hydronephrosis visualized.

Left Kidney: Length: 10.0 cm. Echogenicity within normal limits. No
mass or hydronephrosis visualized.

Abdominal aorta: No aneurysm visualized.

Other findings: None.
IMPRESSION: Normal abdominal ultrasound.
